# Patient Record
Sex: Female | Born: 1965 | Race: Black or African American | Hispanic: No | Marital: Married | Smoking: Never smoker
Health system: Southern US, Community
[De-identification: ages and names within clinical notes are randomized; demographics above are authoritative.]

## PROBLEM LIST (undated history)

## (undated) DIAGNOSIS — F329 Major depressive disorder, single episode, unspecified: Secondary | ICD-10-CM

## (undated) DIAGNOSIS — T7840XA Allergy, unspecified, initial encounter: Secondary | ICD-10-CM

## (undated) DIAGNOSIS — R739 Hyperglycemia, unspecified: Secondary | ICD-10-CM

## (undated) DIAGNOSIS — O4400 Placenta previa specified as without hemorrhage, unspecified trimester: Secondary | ICD-10-CM

## (undated) DIAGNOSIS — Z8639 Personal history of other endocrine, nutritional and metabolic disease: Secondary | ICD-10-CM

## (undated) DIAGNOSIS — T884XXA Failed or difficult intubation, initial encounter: Secondary | ICD-10-CM

## (undated) DIAGNOSIS — J309 Allergic rhinitis, unspecified: Secondary | ICD-10-CM

## (undated) DIAGNOSIS — Z86018 Personal history of other benign neoplasm: Secondary | ICD-10-CM

## (undated) DIAGNOSIS — M25562 Pain in left knee: Secondary | ICD-10-CM

## (undated) DIAGNOSIS — K219 Gastro-esophageal reflux disease without esophagitis: Secondary | ICD-10-CM

## (undated) DIAGNOSIS — D509 Iron deficiency anemia, unspecified: Secondary | ICD-10-CM

## (undated) DIAGNOSIS — M25561 Pain in right knee: Secondary | ICD-10-CM

## (undated) DIAGNOSIS — F32A Depression, unspecified: Secondary | ICD-10-CM

## (undated) DIAGNOSIS — N200 Calculus of kidney: Secondary | ICD-10-CM

## (undated) HISTORY — DX: Gastro-esophageal reflux disease without esophagitis: K21.9

## (undated) HISTORY — PX: COLONOSCOPY: SHX174

## (undated) HISTORY — DX: Iron deficiency anemia, unspecified: D50.9

## (undated) HISTORY — DX: Hyperglycemia, unspecified: R73.9

## (undated) HISTORY — DX: Calculus of kidney: N20.0

## (undated) HISTORY — DX: Allergic rhinitis, unspecified: J30.9

## (undated) HISTORY — DX: Complete placenta previa nos or without hemorrhage, unspecified trimester: O44.00

## (undated) HISTORY — PX: WISDOM TOOTH EXTRACTION: SHX21

## (undated) HISTORY — DX: Personal history of other benign neoplasm: Z86.018

## (undated) HISTORY — PX: POLYPECTOMY: SHX149

## (undated) HISTORY — DX: Allergy, unspecified, initial encounter: T78.40XA

## (undated) HISTORY — PX: ROTATOR CUFF REPAIR: SHX139

## (undated) SURGERY — Surgical Case
Anesthesia: *Unknown

---

## 1989-12-25 DIAGNOSIS — O4400 Placenta previa specified as without hemorrhage, unspecified trimester: Secondary | ICD-10-CM

## 1989-12-25 HISTORY — DX: Complete placenta previa nos or without hemorrhage, unspecified trimester: O44.00

## 1994-12-25 HISTORY — PX: BACK SURGERY: SHX140

## 2001-01-14 ENCOUNTER — Other Ambulatory Visit: Admission: RE | Admit: 2001-01-14 | Discharge: 2001-01-14 | Payer: Self-pay | Admitting: Family Medicine

## 2001-01-14 ENCOUNTER — Other Ambulatory Visit: Admission: RE | Admit: 2001-01-14 | Discharge: 2001-01-14 | Payer: Self-pay | Admitting: *Deleted

## 2002-04-22 ENCOUNTER — Other Ambulatory Visit: Admission: RE | Admit: 2002-04-22 | Discharge: 2002-04-22 | Payer: Self-pay | Admitting: Family Medicine

## 2003-03-31 ENCOUNTER — Other Ambulatory Visit: Admission: RE | Admit: 2003-03-31 | Discharge: 2003-03-31 | Payer: Self-pay | Admitting: Otolaryngology

## 2003-08-13 ENCOUNTER — Other Ambulatory Visit: Admission: RE | Admit: 2003-08-13 | Discharge: 2003-08-13 | Payer: Self-pay | Admitting: Family Medicine

## 2004-11-03 ENCOUNTER — Other Ambulatory Visit: Admission: RE | Admit: 2004-11-03 | Discharge: 2004-11-03 | Payer: Self-pay | Admitting: Family Medicine

## 2005-11-09 ENCOUNTER — Other Ambulatory Visit: Admission: RE | Admit: 2005-11-09 | Discharge: 2005-11-09 | Payer: Self-pay | Admitting: Family Medicine

## 2007-05-13 ENCOUNTER — Other Ambulatory Visit: Admission: RE | Admit: 2007-05-13 | Discharge: 2007-05-13 | Payer: Self-pay | Admitting: Family Medicine

## 2008-07-27 ENCOUNTER — Ambulatory Visit: Payer: Self-pay | Admitting: Internal Medicine

## 2008-08-17 ENCOUNTER — Ambulatory Visit: Payer: Self-pay | Admitting: Internal Medicine

## 2010-12-25 DIAGNOSIS — R739 Hyperglycemia, unspecified: Secondary | ICD-10-CM

## 2010-12-25 HISTORY — DX: Hyperglycemia, unspecified: R73.9

## 2011-08-14 ENCOUNTER — Ambulatory Visit: Payer: Self-pay

## 2011-08-21 ENCOUNTER — Encounter: Payer: Self-pay | Admitting: *Deleted

## 2011-08-21 ENCOUNTER — Encounter: Payer: BC Managed Care – PPO | Attending: Obstetrics and Gynecology | Admitting: *Deleted

## 2011-08-21 DIAGNOSIS — Z713 Dietary counseling and surveillance: Secondary | ICD-10-CM | POA: Insufficient documentation

## 2011-08-21 DIAGNOSIS — R7309 Other abnormal glucose: Secondary | ICD-10-CM | POA: Insufficient documentation

## 2011-08-21 NOTE — Progress Notes (Signed)
  Medical Nutrition Therapy:  Appt start time: 08:15a  end time:  09:15a  Assessment:  Primary concerns today: Hyperglycemia.  Pt here with hyperglycemia and upward trending A1c (5.7% to 5.8% in 3 months). Food recall reveals increased CHO and fat at meals and pt reports eating lunch and dinner out often.  Also reports daily breakfast skipping. Pt works in high stress job and travels often to territory via car.  States she snacks on chips, almonds, etc. "quite a bit" to keep her going.  Has made some changes to dietary intake since Feb 2012 including decrease of 20 oz Pepsi/day to 20 oz only 2x/month.   MEDICATIONS:  Vit D, Fe, MVI    DIETARY INTAKE:  Usual eating pattern includes 2 meals and 1-2 snacks per day.  24-hr recall:  B ( AM): SKIPS  Snk ( AM): None L (PM): [employer] chicken nuggets w/ sauce, FF; Orange drink or lemonade Snk ( PM): Almonds, chips D ( PM): Kickback [employer] w/ FF; water Snk ( PM): None  Usual physical activity: Recently increased exercise; inconsistent, but [place] or [place] 30 min/day, 2x/week  Estimated energy needs: 1300-1500 calories 160-185 g carbohydrates 65-75 g protein 45-50 g fat  Progress Towards Goal(s):  NEW   Nutritional Diagnosis:  [place]-2.2 Altered nutrition-related laboratory related to excessive CHO intake as evidenced by upward trend of A1c (5.7% to 5.8%) over last 6 months.    Intervention/Goals:  Follow Diabetes Meal Plan as instructed (see Yellow Card).  Eat 3 meals and 2 snacks, every 3-5 hrs. NO meal skipping.  Limit carbohydrate intake to 45 grams carbohydrate/meal.  Limit carbohydrate intake to 15 grams carbohydrate/snack.  Add lean protein foods to all meals/snacks.  Limit meals away from home - try taking lunch.   Aim for 30-45 mins of physical activity 2-3 days a week.  Handouts given during visit include:  "Living Well with Diabetes" booklet  CHO/Pro snack list  CHO meal plans (45g,  60g)  Monitoring/Evaluation:  Dietary intake, exercise, A1c, and body weight in 2 month(s).

## 2011-08-21 NOTE — Patient Instructions (Addendum)
Goals:  Follow Diabetes Meal Plan as instructed (see Yellow Card).  Eat 3 meals and 2 snacks, every 3-5 hrs. NO meal skipping.  Limit carbohydrate intake to 45 grams carbohydrate/meal.  Limit carbohydrate intake to 15 grams carbohydrate/snack.  Add lean protein foods to all meals/snacks.  Limit meals away from home - try taking lunch.   Aim for 30-45 mins of physical activity 2-3 days a week.

## 2012-07-13 ENCOUNTER — Other Ambulatory Visit: Payer: Self-pay | Admitting: Internal Medicine

## 2012-07-13 ENCOUNTER — Ambulatory Visit
Admission: RE | Admit: 2012-07-13 | Discharge: 2012-07-13 | Disposition: A | Discharge status: Home / Self Care | Payer: BC Managed Care – PPO | Source: Ambulatory Visit | Attending: Internal Medicine | Admitting: Internal Medicine

## 2012-07-13 DIAGNOSIS — R52 Pain, unspecified: Secondary | ICD-10-CM

## 2013-08-19 ENCOUNTER — Encounter: Payer: Self-pay | Admitting: Internal Medicine

## 2013-08-28 ENCOUNTER — Encounter: Payer: Self-pay | Admitting: Internal Medicine

## 2013-11-02 ENCOUNTER — Emergency Department (INDEPENDENT_AMBULATORY_CARE_PROVIDER_SITE_OTHER)
Admission: EM | Admit: 2013-11-02 | Discharge: 2013-11-02 | Disposition: A | Discharge status: Home / Self Care | Payer: BC Managed Care – PPO | Source: Home / Self Care | Attending: Family Medicine | Admitting: Family Medicine

## 2013-11-02 ENCOUNTER — Encounter (HOSPITAL_COMMUNITY): Payer: Self-pay | Admitting: Emergency Medicine

## 2013-11-02 DIAGNOSIS — S239XXA Sprain of unspecified parts of thorax, initial encounter: Secondary | ICD-10-CM

## 2013-11-02 DIAGNOSIS — S29012A Strain of muscle and tendon of back wall of thorax, initial encounter: Secondary | ICD-10-CM

## 2013-11-02 MED ORDER — KETOROLAC TROMETHAMINE 30 MG/ML IJ SOLN
30.0000 mg | Freq: Once | INTRAMUSCULAR | Status: AC
  Administered 2013-11-02: 30 mg via INTRAMUSCULAR

## 2013-11-02 MED ORDER — CYCLOBENZAPRINE HCL 5 MG PO TABS: 5.0000 mg | ORAL_TABLET | Freq: Three times a day (TID) | ORAL | Status: DC | PRN

## 2013-11-02 MED ORDER — KETOROLAC TROMETHAMINE 30 MG/ML IJ SOLN
INTRAMUSCULAR | Status: AC
  Filled 2013-11-02: qty 1

## 2013-11-02 MED ORDER — DICLOFENAC POTASSIUM 50 MG PO TABS: 50.0000 mg | ORAL_TABLET | Freq: Three times a day (TID) | ORAL | Status: DC

## 2013-11-02 NOTE — ED Notes (Signed)
47 yr old is here with complaints of Left shoulder and back pain x 8 dys. She states she has been lifting a lot moving into new home. She states she has tried Aleve, Tramadol, and heating pad.  No other compliants

## 2013-11-02 NOTE — ED Provider Notes (Signed)
CSN: [id number]     Arrival date & time 11/02/13  1854 History   First MD Initiated Contact with Patient 11/02/13 1909     Chief Complaint  Patient presents with  . Shoulder Pain  . Back Pain   (Consider location/radiation/quality/duration/timing/severity/associated sxs/prior Treatment) Patient is a 47 y.o. female presenting with shoulder pain. The history is provided by the patient.  Shoulder Pain This is a new problem. The current episode started more than 2 days ago (moving and lifting heavy boxes, NKI.). The problem has been gradually worsening. Pertinent negatives include no shortness of breath.    Past Medical History  Diagnosis Date  . History of uterine fibroid     Per patient report  . Placenta previa 1991  . Hyperglycemia 2012    Dr. Carrington Clamp   Past Surgical History  Procedure Laterality Date  . Cesarean section  08/15/90   Family History  Problem Relation Age of Onset  . Diabetes Maternal Aunt   . Diabetes Maternal Aunt   . Cancer Other     Colon   History  Substance Use Topics  . Smoking status: Never Smoker   . Smokeless tobacco: Not on file  . Alcohol Use: 0.0 oz/week     Comment: Rare; social drinker   OB History   Grav Para Term Preterm Abortions TAB SAB Ect Mult Living                 Review of Systems  Constitutional: Negative.   Respiratory: Negative for shortness of breath.   Cardiovascular: Negative.   Gastrointestinal: Negative.   Musculoskeletal: Positive for back pain and myalgias.    Allergies  Sulfamethoxazole-trimethoprim and Bactrim  Home Medications   Current Outpatient Rx  Name  Route  Sig  Dispense  Refill  . Cholecalciferol (VITAMIN D PO)   Oral   Take 1 tablet by mouth daily.           . ferrous sulfate (IRON SUPPLEMENT) 325 (65 FE) MG tablet   Oral   Take 325 mg by mouth daily.           . Multiple Vitamins-Calcium (ONE-A-DAY WOMENS) tablet   Oral   Take 1 tablet by mouth daily.           .  cyclobenzaprine (FLEXERIL) 5 MG tablet   Oral   Take 1 tablet (5 mg total) by mouth 3 (three) times daily as needed for muscle spasms.   30 tablet   0   . diclofenac (CATAFLAM) 50 MG tablet   Oral   Take 1 tablet (50 mg total) by mouth 3 (three) times daily. For pain   21 tablet   0    BP 145/76  Pulse 84  Temp(Src) 97.7 F (36.5 C) (Oral)  Resp 18  SpO2 100%  LMP 09/02/2013 Physical Exam  Nursing note and vitals reviewed. Constitutional: She is oriented to person, place, and time. She appears well-developed and well-nourished.  Neck: Normal range of motion. Neck supple.  Cardiovascular: Normal rate.   Pulmonary/Chest: Effort normal and breath sounds normal. She exhibits tenderness.  Acute tenderness to left medial scapula over deltoid to palpation and with rom., nv intact  Neurological: She is alert and oriented to person, place, and time.  Skin: Skin is warm and dry.    ED Course  Procedures (including critical care time) Labs Review Labs Reviewed - No data to display Imaging Review No results found.  EKG Interpretation  Ventricular Rate:    PR Interval:    QRS Duration:   QT Interval:    QTC Calculation:   R Axis:     Text Interpretation:              MDM     Linna Hoff, MD 11/02/13 1940

## 2013-12-05 ENCOUNTER — Other Ambulatory Visit (HOSPITAL_COMMUNITY): Payer: Self-pay

## 2013-12-05 DIAGNOSIS — R1310 Dysphagia, unspecified: Secondary | ICD-10-CM

## 2013-12-10 ENCOUNTER — Ambulatory Visit (HOSPITAL_COMMUNITY)
Admission: RE | Admit: 2013-12-10 | Discharge: 2013-12-10 | Disposition: A | Discharge status: Home / Self Care | Payer: BC Managed Care – PPO | Source: Ambulatory Visit | Attending: Internal Medicine | Admitting: Internal Medicine

## 2013-12-10 DIAGNOSIS — R6889 Other general symptoms and signs: Secondary | ICD-10-CM | POA: Insufficient documentation

## 2013-12-10 DIAGNOSIS — R1313 Dysphagia, pharyngeal phase: Secondary | ICD-10-CM | POA: Insufficient documentation

## 2013-12-10 DIAGNOSIS — R1310 Dysphagia, unspecified: Secondary | ICD-10-CM

## 2013-12-10 DIAGNOSIS — R1311 Dysphagia, oral phase: Secondary | ICD-10-CM | POA: Insufficient documentation

## 2013-12-10 DIAGNOSIS — R05 Cough: Secondary | ICD-10-CM | POA: Insufficient documentation

## 2013-12-10 DIAGNOSIS — R059 Cough, unspecified: Secondary | ICD-10-CM | POA: Insufficient documentation

## 2013-12-11 DIAGNOSIS — R1313 Dysphagia, pharyngeal phase: Secondary | ICD-10-CM | POA: Diagnosis not present

## 2013-12-11 NOTE — Procedures (Signed)
Objective Swallowing Evaluation: Modified Barium Swallowing Study  Late entry  Patient Details  Name: Caitlin Hansen MRN: [id number] Date of Birth: Nov 23, 1966  Today's Date: 12/10/2013 Time: 1130-1200 SLP Time Calculation (min): 30 min  Past Medical History:  Past Medical History  Diagnosis Date  . History of uterine fibroid     Per patient report  . Placenta previa 1991  . Hyperglycemia 2012    Dr. Carrington Clamp   Past Surgical History:  Past Surgical History  Procedure Laterality Date  . Cesarean section  08/15/90   HPI:  Pt is a 47 year old female arriving for an outpatient MBS due to report of frequent coughing/choking, particularly with liquids. She denies any history of GERD or pneumonia. She does report she often wakes in the night coughing and has a feeling of something in her throat after eating.      Assessment / Plan / Recommendation Clinical Impression  Dysphagia Diagnosis: Mild oral phase dysphagia Clinical impression: Pt presents with a mild oral dysphagia exacerbated by pt anxiety related to choking. Pt briefly holds bolus with inadequate base of tongue to velum pressure allowing for poor posterior bolus containment and premature spillage to the pyriforms with few intances of flash penetration. Pt does not aspirate, but does demonstrate a hypersensitivity to these events with immediate cough. This is improved with a chin tuck which allows pt to hold bolus more anteriorally in mouth prior to bolus transit. Pt reports she does this instinctively sometimes. Furthermore, after the swallow is complete, pt has frequent throat clearing and cough, though nothing is present in pharynx of airway. Question referred sensation from esophagus. Pt has other complaints that could indicate a type of 'silent reflux' that could contribute to throat clearing and globus. Pt recommended to continue current diet with a chin tuck and f/u with primary MD regarding results of exam. No SLP f/u  needed.     Treatment Recommendation  No treatment recommended at this time    Diet Recommendation Regular;Thin liquid   Liquid Administration via: Cup;Straw Medication Administration: Whole meds with puree Supervision: Patient able to self feed Postural Changes and/or Swallow Maneuvers: Chin tuck    Other  Recommendations Oral Care Recommendations: Patient independent with oral care   Follow Up Recommendations  None    Frequency and Duration        Pertinent Vitals/Pain NA    SLP Swallow Goals     General HPI: Pt is a 47 year old female arriving for an outpatient MBS due to report of frequent coughing/choking, particularly with liquids. She denies any history of GERD or pneumonia. She does report she often wakes in the night coughing and has a feeling of something in her throat after eating.  Type of Study: Modified Barium Swallowing Study Reason for Referral: Objectively evaluate swallowing function Previous Swallow Assessment: none Diet Prior to this Study: Regular;Thin liquids Temperature Spikes Noted: No Respiratory Status: Room air History of Recent Intubation: No Behavior/Cognition: Alert;Cooperative;Pleasant mood Oral Cavity - Dentition: Adequate natural dentition Oral Motor / Sensory Function: Within functional limits Self-Feeding Abilities: Able to feed self Patient Positioning: Upright in chair Baseline Vocal Quality: Clear Volitional Cough: Strong Volitional Swallow: Able to elicit Anatomy: Within functional limits    Reason for Referral Objectively evaluate swallowing function   Oral Phase Oral Preparation/Oral Phase Oral Phase: Impaired Oral - Thin Oral - Thin Cup: Holding of bolus;Piecemeal swallowing;Delayed oral transit (posterior bolus loss) Oral - Thin Straw: Holding of bolus;Piecemeal swallowing;Delayed oral transit  Oral - Solids Oral - Puree: Delayed oral transit;Piecemeal swallowing Oral - Regular: Piecemeal swallowing;Delayed oral  transit Oral - Pill: Holding of bolus;Reduced posterior propulsion;Delayed oral transit (extreme posterior head tilt)   Pharyngeal Phase Pharyngeal Phase Pharyngeal Phase: Impaired Pharyngeal - Thin Pharyngeal - Thin Cup: Premature spillage to pyriform sinuses;Penetration/Aspiration before swallow Penetration/Aspiration details (thin cup): Material does not enter airway;Material enters airway, remains ABOVE vocal cords then ejected out Pharyngeal - Thin Straw: Premature spillage to pyriform sinuses;Penetration/Aspiration before swallow Penetration/Aspiration details (thin straw): Material does not enter airway;Material enters airway, remains ABOVE vocal cords then ejected out Pharyngeal - Solids Pharyngeal - Puree: Within functional limits Pharyngeal - Regular: Within functional limits Pharyngeal - Pill: Within functional limits  Cervical Esophageal Phase    GO    Cervical Esophageal Phase Cervical Esophageal Phase: [facility]    Functional Assessment Tool Used: clinical judgement Functional Limitations: Swallowing Swallow Current Status ([id number]): At least 1 percent but less than 20 percent impaired, limited or restricted Swallow Goal Status [phone]): At least 1 percent but less than 20 percent impaired, limited or restricted Swallow Discharge Status [phone]): At least 1 percent but less than 20 percent impaired, limited or restricted    Kateryn Marasigan, Riley Nearing 12/11/2013, 10:41 PM

## 2013-12-17 ENCOUNTER — Other Ambulatory Visit: Payer: Self-pay | Admitting: Obstetrics and Gynecology

## 2013-12-17 DIAGNOSIS — Z3042 Encounter for surveillance of injectable contraceptive: Secondary | ICD-10-CM

## 2013-12-19 ENCOUNTER — Ambulatory Visit
Admission: RE | Admit: 2013-12-19 | Discharge: 2013-12-19 | Disposition: A | Discharge status: Home / Self Care | Payer: BC Managed Care – PPO | Source: Ambulatory Visit | Attending: Obstetrics and Gynecology | Admitting: Obstetrics and Gynecology

## 2013-12-19 DIAGNOSIS — Z3042 Encounter for surveillance of injectable contraceptive: Secondary | ICD-10-CM

## 2014-01-15 ENCOUNTER — Ambulatory Visit
Admission: RE | Admit: 2014-01-15 | Discharge: 2014-01-15 | Disposition: A | Discharge status: Home / Self Care | Payer: BC Managed Care – PPO | Source: Ambulatory Visit | Attending: Obstetrics and Gynecology | Admitting: Obstetrics and Gynecology

## 2014-04-06 ENCOUNTER — Emergency Department (HOSPITAL_COMMUNITY): Payer: BC Managed Care – PPO

## 2014-04-06 ENCOUNTER — Encounter (HOSPITAL_COMMUNITY): Payer: Self-pay | Admitting: Emergency Medicine

## 2014-04-06 ENCOUNTER — Emergency Department (HOSPITAL_COMMUNITY)
Admission: EM | Admit: 2014-04-06 | Discharge: 2014-04-07 | Disposition: A | Discharge status: Home / Self Care | Payer: BC Managed Care – PPO | Attending: Emergency Medicine | Admitting: Emergency Medicine

## 2014-04-06 DIAGNOSIS — Z8742 Personal history of other diseases of the female genital tract: Secondary | ICD-10-CM | POA: Diagnosis not present

## 2014-04-06 DIAGNOSIS — S40019A Contusion of unspecified shoulder, initial encounter: Secondary | ICD-10-CM | POA: Diagnosis not present

## 2014-04-06 DIAGNOSIS — S139XXA Sprain of joints and ligaments of unspecified parts of neck, initial encounter: Secondary | ICD-10-CM | POA: Diagnosis not present

## 2014-04-06 DIAGNOSIS — Y92410 Unspecified street and highway as the place of occurrence of the external cause: Secondary | ICD-10-CM | POA: Insufficient documentation

## 2014-04-06 DIAGNOSIS — Y9389 Activity, other specified: Secondary | ICD-10-CM | POA: Diagnosis not present

## 2014-04-06 DIAGNOSIS — S0990XA Unspecified injury of head, initial encounter: Secondary | ICD-10-CM | POA: Diagnosis not present

## 2014-04-06 DIAGNOSIS — Z79899 Other long term (current) drug therapy: Secondary | ICD-10-CM | POA: Diagnosis not present

## 2014-04-06 DIAGNOSIS — S0993XA Unspecified injury of face, initial encounter: Secondary | ICD-10-CM | POA: Diagnosis present

## 2014-04-06 DIAGNOSIS — S161XXA Strain of muscle, fascia and tendon at neck level, initial encounter: Secondary | ICD-10-CM

## 2014-04-06 MED ORDER — OXYCODONE-ACETAMINOPHEN 5-325 MG PO TABS
2.0000 | ORAL_TABLET | Freq: Once | ORAL | Status: AC
  Administered 2014-04-06: 2 via ORAL
  Filled 2014-04-06: qty 2

## 2014-04-06 MED ORDER — CYCLOBENZAPRINE HCL 10 MG PO TABS
5.0000 mg | ORAL_TABLET | Freq: Once | ORAL | Status: AC
  Administered 2014-04-06: 5 mg via ORAL
  Filled 2014-04-06: qty 1
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
## 2014-04-07 DIAGNOSIS — S139XXA Sprain of joints and ligaments of unspecified parts of neck, initial encounter: Secondary | ICD-10-CM | POA: Diagnosis not present

## 2014-04-07 MED ORDER — ONDANSETRON HCL 4 MG PO TABS: 4.0000 mg | ORAL_TABLET | Freq: Four times a day (QID) | ORAL | Status: DC

## 2014-04-07 MED ORDER — ONDANSETRON 4 MG PO TBDP
8.0000 mg | ORAL_TABLET | Freq: Once | ORAL | Status: AC
  Administered 2014-04-07: 8 mg via ORAL
  Filled 2014-04-07: qty 2

## 2014-04-07 MED ORDER — HYDROCODONE-ACETAMINOPHEN 5-325 MG PO TABS: 1.0000 | ORAL_TABLET | Freq: Four times a day (QID) | ORAL | Status: DC | PRN

## 2014-04-07 MED ORDER — CYCLOBENZAPRINE HCL 10 MG PO TABS: 10.0000 mg | ORAL_TABLET | Freq: Two times a day (BID) | ORAL | Status: DC | PRN
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
## 2014-04-22 ENCOUNTER — Other Ambulatory Visit: Payer: Self-pay | Admitting: Internal Medicine

## 2014-04-22 DIAGNOSIS — M25512 Pain in left shoulder: Secondary | ICD-10-CM

## 2014-04-22 DIAGNOSIS — R519 Headache, unspecified: Secondary | ICD-10-CM

## 2014-04-22 DIAGNOSIS — R51 Headache: Secondary | ICD-10-CM

## 2014-04-29 ENCOUNTER — Ambulatory Visit
Admission: RE | Admit: 2014-04-29 | Discharge: 2014-04-29 | Disposition: A | Discharge status: Home / Self Care | Payer: BC Managed Care – PPO | Source: Ambulatory Visit | Attending: Internal Medicine | Admitting: Internal Medicine

## 2014-04-29 DIAGNOSIS — R51 Headache: Secondary | ICD-10-CM

## 2014-04-29 DIAGNOSIS — R519 Headache, unspecified: Secondary | ICD-10-CM

## 2014-04-29 DIAGNOSIS — M25512 Pain in left shoulder: Secondary | ICD-10-CM

## 2014-07-15 ENCOUNTER — Encounter: Payer: Self-pay | Admitting: Internal Medicine

## 2014-10-08 ENCOUNTER — Encounter: Payer: Self-pay | Admitting: Internal Medicine

## 2014-11-13 ENCOUNTER — Ambulatory Visit (AMBULATORY_SURGERY_CENTER): Payer: Self-pay | Admitting: *Deleted

## 2014-11-13 VITALS — Ht 65.0 in | Wt 163.0 lb

## 2014-11-13 DIAGNOSIS — Z800 Family history of malignant neoplasm of digestive organs: Secondary | ICD-10-CM

## 2014-11-13 MED ORDER — MOVIPREP 100 G PO SOLR: 1.0000 | Freq: Once | ORAL | Status: DC

## 2014-11-13 NOTE — Progress Notes (Signed)
No egg or soy allergy. No anesthesia problems.  

## 2014-11-23 ENCOUNTER — Encounter: Payer: Self-pay | Admitting: Internal Medicine

## 2014-11-23 ENCOUNTER — Ambulatory Visit (AMBULATORY_SURGERY_CENTER): Payer: BC Managed Care – PPO | Admitting: Internal Medicine

## 2014-11-23 VITALS — BP 148/77 | HR 80 | Temp 97.8°F | Resp 14 | Ht 65.0 in | Wt 163.0 lb

## 2014-11-23 DIAGNOSIS — Z800 Family history of malignant neoplasm of digestive organs: Secondary | ICD-10-CM

## 2014-11-23 MED ORDER — SODIUM CHLORIDE 0.9 % IV SOLN: 500.0000 mL | INTRAVENOUS | Status: DC

## 2014-11-23 NOTE — Progress Notes (Signed)
A/ox3, pleased with MAC, report to RN 

## 2014-11-23 NOTE — Op Note (Signed)
[place]  [address] [place], [address]   COLONOSCOPY PROCEDURE REPORT  PATIENT: Caitlin Hansen, Caitlin Hansen  MR#: [id number] BIRTHDATE: 02-Apr-1966 , 9  yrs. old GENDER: female ENDOSCOPIST: Eustace Quail, MD REFERRED DU:[id number] Recall, PROCEDURE DATE:  11/23/2014 PROCEDURE:   Colonoscopy, screening  (high risk) First Screening Colonoscopy - Avg.  risk and is 50 yrs.  old or older - No.  Prior Negative Screening - Now for repeat screening. N/A  History of Adenoma - Now for follow-up colonoscopy & has been > or = to 3 yrs.  N/A  Polyps Removed Today? No.  Recommend repeat exam, <10 yrs? Yes.  High risk (family or personal hx). ASA CLASS:   Class I INDICATIONS:patient's immediate family history of colon cancer(mom 54). Negative index exam August 2009. MEDICATIONS: Monitored anesthesia care and Propofol 250 mg IV  DESCRIPTION OF PROCEDURE:   After the risks benefits and alternatives of the procedure were thoroughly explained, informed consent was obtained.  The digital rectal exam revealed no abnormalities of the rectum.   The LB [id number]  endoscope was introduced through the anus and advanced to the cecum, which was identified by both the appendix and ileocecal valve. No adverse events experienced.   The quality of the prep was excellent, using MoviPrep  The instrument was then slowly withdrawn as the colon was fully examined.     COLON FINDINGS: A normal appearing cecum, ileocecal valve, and appendiceal orifice were identified.  The ascending, transverse, descending, sigmoid colon, and rectum appeared unremarkable. Retroflexed views revealed no abnormalities. The time to cecum=1 minutes 37 seconds.  Withdrawal time=8 minutes 19 seconds.  The scope was withdrawn and the procedure completed. COMPLICATIONS: There were no immediate complications.  ENDOSCOPIC IMPRESSION: Normal colonoscopy  RECOMMENDATIONS: Follow up colonoscopy in 5 years (family  history)  eSigned:  Eustace Quail, MD 11/23/2014 1:53 PM   cc: Trilby Drummer MD and The Patient

## 2014-11-23 NOTE — Patient Instructions (Signed)

## 2014-11-24 ENCOUNTER — Telehealth: Payer: Self-pay | Admitting: *Deleted

## 2014-11-24 NOTE — Telephone Encounter (Signed)
  Follow up Call-  Call back number 11/23/2014  Post procedure Call Back phone  # [phone]  Permission to leave phone message Yes     Patient questions:  Do you have a fever, pain , or abdominal swelling? No. Pain Score  0 *  Have you tolerated food without any problems? Yes.    Have you been able to return to your normal activities? Yes.    Do you have any questions about your discharge instructions: Diet   No. Medications  No. Follow up visit  No.  Do you have questions or concerns about your Care? No.  Actions: * If pain score is 4 or above: No action needed, pain <4.

## 2014-12-14 ENCOUNTER — Encounter: Payer: BC Managed Care – PPO | Admitting: Internal Medicine

## 2015-11-12 ENCOUNTER — Encounter: Payer: Self-pay | Admitting: Internal Medicine

## 2016-03-24 ENCOUNTER — Other Ambulatory Visit: Payer: Self-pay | Admitting: Obstetrics and Gynecology

## 2016-04-11 NOTE — Patient Instructions (Signed)
Your procedure is scheduled on:  Wednesday, April 19, 2016  Enter through the [employer] at: 9:00 AM  Pick up the phone at the desk and dial [phone].  Call this number if you have problems the morning of surgery: [phone].  Remember:  Do NOT eat food or drink after:  Midnight Tuesday  Take these medicines the morning of surgery with a SIP OF WATER:  None  Do NOT wear jewelry (body piercing), metal hair clips/bobby pins, make-up, or nail polish. Do NOT wear lotions, powders, or perfumes.  You may wear deodorant. Do NOT shave for 48 hours prior to surgery. Do NOT bring valuables to the hospital. Contacts, dentures, or bridgework may not be worn into surgery.  Leave suitcase in car.  After surgery it may be brought to your room.  For patients admitted to the hospital, checkout time is 11:00 AM the day of discharge.

## 2016-04-12 ENCOUNTER — Encounter (HOSPITAL_COMMUNITY): Payer: Self-pay

## 2016-04-12 ENCOUNTER — Encounter (HOSPITAL_COMMUNITY)
Admission: RE | Admit: 2016-04-12 | Discharge: 2016-04-12 | Disposition: A | Discharge status: Home / Self Care | Source: Ambulatory Visit | Attending: Obstetrics and Gynecology | Admitting: Obstetrics and Gynecology

## 2016-04-12 DIAGNOSIS — Z01812 Encounter for preprocedural laboratory examination: Secondary | ICD-10-CM | POA: Insufficient documentation

## 2016-04-12 DIAGNOSIS — D259 Leiomyoma of uterus, unspecified: Secondary | ICD-10-CM | POA: Diagnosis not present

## 2016-04-12 DIAGNOSIS — F329 Major depressive disorder, single episode, unspecified: Secondary | ICD-10-CM | POA: Diagnosis not present

## 2016-04-12 DIAGNOSIS — N939 Abnormal uterine and vaginal bleeding, unspecified: Secondary | ICD-10-CM | POA: Insufficient documentation

## 2016-04-12 HISTORY — DX: Major depressive disorder, single episode, unspecified: F32.9

## 2016-04-12 HISTORY — DX: Depression, unspecified: F32.A

## 2016-04-12 HISTORY — DX: Pain in left knee: M25.562

## 2016-04-12 HISTORY — DX: Pain in right knee: M25.561

## 2016-04-12 HISTORY — DX: Personal history of other endocrine, nutritional and metabolic disease: Z86.39

## 2016-04-12 LAB — CBC
HCT: 37.3 % (ref 36.0–46.0)
Hemoglobin: 12.4 g/dL (ref 12.0–15.0)
MCH: 30.5 pg (ref 26.0–34.0)
MCHC: 33.2 g/dL (ref 30.0–36.0)
MCV: 91.9 fL (ref 78.0–100.0)
Platelets: 306 10*3/uL (ref 150–400)
RBC: 4.06 MIL/uL (ref 3.87–5.11)
RDW: 12.4 % (ref 11.5–15.5)
WBC: 8.8 10*3/uL (ref 4.0–10.5)

## 2016-04-12 LAB — TYPE AND SCREEN
ABO/RH(D): B POS
Antibody Screen: NEGATIVE

## 2016-04-12 LAB — ABO/RH: ABO/RH(D): B POS

## 2016-04-13 NOTE — Anesthesia Preprocedure Evaluation (Addendum)
Anesthesia Evaluation  Patient identified by MRN, date of birth, ID band Patient awake    Reviewed: Allergy & Precautions, NPO status , Patient's Chart, lab work & pertinent test results  Airway Mallampati: II   Neck ROM: Full    Dental  (+) Dental Advisory Given, Teeth Intact   Pulmonary neg pulmonary ROS,    breath sounds clear to auscultation       Cardiovascular negative cardio ROS   Rhythm:Regular     Neuro/Psych Depression negative neurological ROS  negative psych ROS   GI/Hepatic negative GI ROS, Neg liver ROS,   Endo/Other  negative endocrine ROS  Renal/GU negative Renal ROS  negative genitourinary   Musculoskeletal negative musculoskeletal ROS (+)   Abdominal   Peds negative pediatric ROS (+)  Hematology negative hematology ROS (+) 12/37   Anesthesia Other Findings   Reproductive/Obstetrics negative OB ROS                            Anesthesia Physical Anesthesia Plan  ASA: II  Anesthesia Plan: General   Post-op Pain Management:    Induction: Intravenous  Airway Management Planned: Oral ETT  Additional Equipment:   Intra-op Plan:   Post-operative Plan: Extubation in OR  Informed Consent: I have reviewed the patients History and Physical, chart, labs and discussed the procedure including the risks, benefits and alternatives for the proposed anesthesia with the patient or authorized representative who has indicated his/her understanding and acceptance.     Plan Discussed with:   Anesthesia Plan Comments:         Anesthesia Quick Evaluation

## 2016-04-16 NOTE — H&P (Signed)
50 y.o. yo complains of continued bleeding on Depo Provera.  Pt has a significant fibroid uterus.  Uterus measures 10x6x7 with multiple fibroids, several submucosal and largest 4 cm.  Past Medical History  Diagnosis Date  . History of uterine fibroid     Per patient report  . Placenta previa 1991  . Hyperglycemia 2012    Dr. Bobbye Charleston  . Depression   . Knee pain, bilateral     aching, popping, "feels like they are going to give out"  . History of thyroid disease     patient took medication, but no longer requires medication   Past Surgical History  Procedure Laterality Date  . Cesarean section  08/15/90  . Rotator cuff repair    . Back surgery  1996    cyst removal  . Wisdom tooth extraction      Social History   Social History  . Marital Status: Married    Spouse Name: N/A  . Number of Children: N/A  . Years of Education: N/A   Occupational History  . Not on file.   Social History Main Topics  . Smoking status: Never Smoker   . Smokeless tobacco: Never Used  . Alcohol Use: 0.0 oz/week    0 Standard drinks or equivalent per week     Comment: Rare; social drinker  . Drug Use: No  . Sexual Activity: Yes    Birth Control/ Protection: Injection   Other Topics Concern  . Not on file   Social History Narrative    No current facility-administered medications on file prior to encounter.   Current Outpatient Prescriptions on File Prior to Encounter  Medication Sig Dispense Refill  . ibuprofen (ADVIL,MOTRIN) 200 MG tablet Take 400 mg by mouth every 6 (six) hours as needed for moderate pain.    . medroxyPROGESTERone (DEPO-PROVERA) 150 MG/ML injection Inject 150 mg into the muscle every 3 (three) months.    . Multiple Vitamins-Calcium (ONE-A-DAY WOMENS) tablet Take 1 tablet by mouth daily.        Allergies  Allergen Reactions  . Sulfamethoxazole-Trimethoprim Other (See Comments)    REACTION: vaginal itching    @VITALS2 @  Lungs: clear to ascultation Cor:   RRR Abdomen:  soft, nontender, nondistended. Ex:  no cords, erythema Pelvic:  Vulva: no masses, no atrophy, no lesions Vagina: no tenderness, no erythema, no abnormal vaginal discharge, no vesicle(s) or ulcers, no cystocele, no rectocele Cervix: grossly normal, no discharge, no cervical motion tenderness, sample taken for a Pap smear Uterus: normal size (12, bulky and mobile), normal shape, midline, no uterine prolapse, non-tender Bladder/Urethra: normal meatus, no urethral discharge, no urethral mass, bladder non distended, Urethra well supported Adnexa/Parametria: no parametrial tenderness, no parametrial mass, no adnexal tenderness, no ovarian mass   A: Symptomatic fibroid uterus with DUB.  EMB was benign.  Will retain ovaries if are normal.   P: All risks, benefits and alternatives d/w patient and she desires to proceed.  Patient has undergone a modified bowel prep and will receive preop antibiotics and SCDs during the operation.     Eppie Barhorst A

## 2016-04-19 ENCOUNTER — Encounter (HOSPITAL_COMMUNITY): Payer: Self-pay | Admitting: Anesthesiology

## 2016-04-19 ENCOUNTER — Encounter (HOSPITAL_COMMUNITY)
Admission: RE | Disposition: A | Discharge status: Home / Self Care | Payer: Self-pay | Source: Ambulatory Visit | Attending: Obstetrics and Gynecology

## 2016-04-19 ENCOUNTER — Ambulatory Visit (HOSPITAL_COMMUNITY): Admitting: Anesthesiology

## 2016-04-19 ENCOUNTER — Ambulatory Visit (HOSPITAL_COMMUNITY)
Admission: RE | Admit: 2016-04-19 | Discharge: 2016-04-20 | Disposition: A | Discharge status: Home / Self Care | Source: Ambulatory Visit | Attending: Obstetrics and Gynecology | Admitting: Obstetrics and Gynecology

## 2016-04-19 DIAGNOSIS — N921 Excessive and frequent menstruation with irregular cycle: Secondary | ICD-10-CM | POA: Diagnosis not present

## 2016-04-19 DIAGNOSIS — N83202 Unspecified ovarian cyst, left side: Secondary | ICD-10-CM | POA: Diagnosis not present

## 2016-04-19 DIAGNOSIS — N800 Endometriosis of uterus: Secondary | ICD-10-CM | POA: Diagnosis not present

## 2016-04-19 DIAGNOSIS — F329 Major depressive disorder, single episode, unspecified: Secondary | ICD-10-CM | POA: Insufficient documentation

## 2016-04-19 DIAGNOSIS — D251 Intramural leiomyoma of uterus: Secondary | ICD-10-CM | POA: Diagnosis not present

## 2016-04-19 DIAGNOSIS — N840 Polyp of corpus uteri: Secondary | ICD-10-CM | POA: Insufficient documentation

## 2016-04-19 DIAGNOSIS — Z9889 Other specified postprocedural states: Secondary | ICD-10-CM

## 2016-04-19 DIAGNOSIS — Z882 Allergy status to sulfonamides status: Secondary | ICD-10-CM | POA: Diagnosis not present

## 2016-04-19 HISTORY — PX: CYSTOSCOPY: SHX5120

## 2016-04-19 HISTORY — PX: ROBOTIC ASSISTED TOTAL HYSTERECTOMY WITH SALPINGECTOMY: SHX6679

## 2016-04-19 LAB — PREGNANCY, URINE: PREG TEST UR: NEGATIVE

## 2016-04-19 SURGERY — ROBOTIC ASSISTED TOTAL HYSTERECTOMY WITH SALPINGECTOMY
Anesthesia: General | Site: Bladder

## 2016-04-19 MED ORDER — FENTANYL CITRATE (PF) 100 MCG/2ML IJ SOLN
INTRAMUSCULAR | Status: DC | PRN
  Administered 2016-04-19 (×2): 100 ug via INTRAVENOUS

## 2016-04-19 MED ORDER — ONDANSETRON HCL 4 MG/2ML IJ SOLN
INTRAMUSCULAR | Status: AC
  Filled 2016-04-19: qty 2

## 2016-04-19 MED ORDER — GLYCOPYRROLATE 0.2 MG/ML IJ SOLN
INTRAMUSCULAR | Status: DC | PRN
  Administered 2016-04-19: .4 mg via INTRAVENOUS

## 2016-04-19 MED ORDER — ROCURONIUM BROMIDE 100 MG/10ML IV SOLN
INTRAVENOUS | Status: AC
  Filled 2016-04-19: qty 1

## 2016-04-19 MED ORDER — MEPERIDINE HCL 25 MG/ML IJ SOLN: 6.2500 mg | INTRAMUSCULAR | Status: DC | PRN

## 2016-04-19 MED ORDER — LIDOCAINE HCL (CARDIAC) 20 MG/ML IV SOLN
INTRAVENOUS | Status: AC
  Filled 2016-04-19: qty 5

## 2016-04-19 MED ORDER — PROPOFOL 10 MG/ML IV BOLUS
INTRAVENOUS | Status: DC | PRN
  Administered 2016-04-19: 50 mg via INTRAVENOUS
  Administered 2016-04-19: 150 mg via INTRAVENOUS

## 2016-04-19 MED ORDER — FENTANYL CITRATE (PF) 100 MCG/2ML IJ SOLN
25.0000 ug | INTRAMUSCULAR | Status: DC | PRN
  Administered 2016-04-19 (×2): 25 ug via INTRAVENOUS
  Administered 2016-04-19 (×2): 50 ug via INTRAVENOUS

## 2016-04-19 MED ORDER — CEFAZOLIN SODIUM-DEXTROSE 2-3 GM-% IV SOLR
INTRAVENOUS | Status: AC
  Filled 2016-04-19: qty 50

## 2016-04-19 MED ORDER — ONDANSETRON HCL 4 MG/2ML IJ SOLN
4.0000 mg | Freq: Four times a day (QID) | INTRAMUSCULAR | Status: DC | PRN
Start: 2016-04-19 — End: 2016-04-20
  Administered 2016-04-19: 4 mg via INTRAVENOUS
  Filled 2016-04-19: qty 2

## 2016-04-19 MED ORDER — LACTATED RINGERS IR SOLN
Status: DC | PRN
  Administered 2016-04-19: 3000 mL

## 2016-04-19 MED ORDER — SODIUM CHLORIDE 0.9 % IV SOLN
INTRAVENOUS | Status: DC | PRN
  Administered 2016-04-19: 60 mL

## 2016-04-19 MED ORDER — PROPOFOL 10 MG/ML IV BOLUS
INTRAVENOUS | Status: AC
  Filled 2016-04-19: qty 20

## 2016-04-19 MED ORDER — FENTANYL CITRATE (PF) 100 MCG/2ML IJ SOLN
INTRAMUSCULAR | Status: AC
  Filled 2016-04-19: qty 2

## 2016-04-19 MED ORDER — FENTANYL CITRATE (PF) 250 MCG/5ML IJ SOLN
INTRAMUSCULAR | Status: AC
  Filled 2016-04-19: qty 5

## 2016-04-19 MED ORDER — SCOPOLAMINE 1 MG/3DAYS TD PT72
MEDICATED_PATCH | TRANSDERMAL | Status: AC
  Administered 2016-04-19: 1.5 mg via TRANSDERMAL
  Filled 2016-04-19: qty 1

## 2016-04-19 MED ORDER — BUTORPHANOL TARTRATE 1 MG/ML IJ SOLN
1.0000 mg | Freq: Once | INTRAMUSCULAR | Status: AC
  Administered 2016-04-19: 1 mg via INTRAVENOUS
  Filled 2016-04-19: qty 1

## 2016-04-19 MED ORDER — NEOSTIGMINE METHYLSULFATE 10 MG/10ML IV SOLN
INTRAVENOUS | Status: DC | PRN
  Administered 2016-04-19: 2 mg via INTRAVENOUS

## 2016-04-19 MED ORDER — MENTHOL 3 MG MT LOZG: 1.0000 | LOZENGE | OROMUCOSAL | Status: DC | PRN

## 2016-04-19 MED ORDER — FENTANYL CITRATE (PF) 100 MCG/2ML IJ SOLN
INTRAMUSCULAR | Status: AC
  Administered 2016-04-19: 25 ug via INTRAVENOUS
  Filled 2016-04-19: qty 2

## 2016-04-19 MED ORDER — ONDANSETRON HCL 4 MG PO TABS: 4.0000 mg | ORAL_TABLET | Freq: Four times a day (QID) | ORAL | Status: DC | PRN

## 2016-04-19 MED ORDER — ONDANSETRON HCL 4 MG/2ML IJ SOLN
INTRAMUSCULAR | Status: DC | PRN
  Administered 2016-04-19: 4 mg via INTRAVENOUS

## 2016-04-19 MED ORDER — MIDAZOLAM HCL 5 MG/5ML IJ SOLN
INTRAMUSCULAR | Status: DC | PRN
  Administered 2016-04-19: 2 mg via INTRAVENOUS

## 2016-04-19 MED ORDER — ARTIFICIAL TEARS OP OINT
TOPICAL_OINTMENT | OPHTHALMIC | Status: AC
  Filled 2016-04-19: qty 3.5

## 2016-04-19 MED ORDER — IBUPROFEN 800 MG PO TABS
800.0000 mg | ORAL_TABLET | Freq: Three times a day (TID) | ORAL | Status: DC | PRN
  Administered 2016-04-20: 800 mg via ORAL
  Filled 2016-04-19: qty 1

## 2016-04-19 MED ORDER — DEXAMETHASONE SODIUM PHOSPHATE 10 MG/ML IJ SOLN
INTRAMUSCULAR | Status: DC | PRN
  Administered 2016-04-19 (×2): 5 mg via INTRAVENOUS

## 2016-04-19 MED ORDER — LIDOCAINE HCL (CARDIAC) 20 MG/ML IV SOLN
INTRAVENOUS | Status: DC | PRN
  Administered 2016-04-19: 40 mg via INTRAVENOUS
  Administered 2016-04-19: 60 mg via INTRAVENOUS

## 2016-04-19 MED ORDER — DEXAMETHASONE SODIUM PHOSPHATE 10 MG/ML IJ SOLN
INTRAMUSCULAR | Status: AC
  Filled 2016-04-19: qty 1

## 2016-04-19 MED ORDER — KETOROLAC TROMETHAMINE 30 MG/ML IJ SOLN
30.0000 mg | Freq: Four times a day (QID) | INTRAMUSCULAR | Status: DC
  Administered 2016-04-19: 30 mg via INTRAVENOUS

## 2016-04-19 MED ORDER — STERILE WATER FOR IRRIGATION IR SOLN
Status: DC | PRN
  Administered 2016-04-19: 1000 mL via INTRAVESICAL

## 2016-04-19 MED ORDER — LACTATED RINGERS IV SOLN
INTRAVENOUS | Status: DC
  Administered 2016-04-19 (×2): via INTRAVENOUS

## 2016-04-19 MED ORDER — KETOROLAC TROMETHAMINE 30 MG/ML IJ SOLN
INTRAMUSCULAR | Status: AC
  Administered 2016-04-19: 30 mg via INTRAVENOUS
  Filled 2016-04-19: qty 1

## 2016-04-19 MED ORDER — SCOPOLAMINE 1 MG/3DAYS TD PT72
1.0000 | MEDICATED_PATCH | Freq: Once | TRANSDERMAL | Status: DC
  Administered 2016-04-19: 1.5 mg via TRANSDERMAL

## 2016-04-19 MED ORDER — ACETAMINOPHEN 10 MG/ML IV SOLN
1000.0000 mg | Freq: Once | INTRAVENOUS | Status: AC
  Administered 2016-04-19: 1000 mg via INTRAVENOUS
  Filled 2016-04-19: qty 100

## 2016-04-19 MED ORDER — OXYCODONE-ACETAMINOPHEN 5-325 MG PO TABS
1.0000 | ORAL_TABLET | ORAL | Status: DC | PRN
  Administered 2016-04-19 – 2016-04-20 (×4): 2 via ORAL
  Filled 2016-04-19 (×4): qty 2

## 2016-04-19 MED ORDER — GLYCOPYRROLATE 0.2 MG/ML IJ SOLN
INTRAMUSCULAR | Status: AC
  Filled 2016-04-19: qty 2

## 2016-04-19 MED ORDER — MIDAZOLAM HCL 2 MG/2ML IJ SOLN
INTRAMUSCULAR | Status: AC
  Filled 2016-04-19: qty 2

## 2016-04-19 MED ORDER — ONDANSETRON HCL 4 MG/2ML IJ SOLN: 4.0000 mg | Freq: Once | INTRAMUSCULAR | Status: DC

## 2016-04-19 MED ORDER — CEFAZOLIN SODIUM-DEXTROSE 2-4 GM/100ML-% IV SOLN
2.0000 g | INTRAVENOUS | Status: AC
  Administered 2016-04-19: 2 g via INTRAVENOUS
  Filled 2016-04-19: qty 100

## 2016-04-19 MED ORDER — ROCURONIUM BROMIDE 100 MG/10ML IV SOLN
INTRAVENOUS | Status: DC | PRN
  Administered 2016-04-19: 10 mg via INTRAVENOUS
  Administered 2016-04-19: 5 mg via INTRAVENOUS
  Administered 2016-04-19: 45 mg via INTRAVENOUS

## 2016-04-19 SURGICAL SUPPLY — 77 items
APL SKNCLS STERI-STRIP NONHPOA (GAUZE/BANDAGES/DRESSINGS)
BARRIER ADHS 3X4 INTERCEED (GAUZE/BANDAGES/DRESSINGS) ×2 IMPLANT
BENZOIN TINCTURE PRP APPL 2/3 (GAUZE/BANDAGES/DRESSINGS) ×2 IMPLANT
BRR ADH 4X3 ABS CNTRL BYND (GAUZE/BANDAGES/DRESSINGS)
CANISTER SUCT 3000ML (MISCELLANEOUS) ×4 IMPLANT
CATH ROBINSON RED A/P 16FR (CATHETERS) ×4 IMPLANT
CHLORAPREP W/TINT 26ML (MISCELLANEOUS) ×4 IMPLANT
CLOTH BEACON ORANGE TIMEOUT ST (SAFETY) ×4 IMPLANT
CONT PATH 16OZ SNAP LID 3702 (MISCELLANEOUS) ×4 IMPLANT
COVER BACK TABLE 60X90IN (DRAPES) ×8 IMPLANT
COVER TIP SHEARS 8 DVNC (MISCELLANEOUS) ×3 IMPLANT
COVER TIP SHEARS 8MM DA VINCI (MISCELLANEOUS) ×1
DECANTER SPIKE VIAL GLASS SM (MISCELLANEOUS) ×4 IMPLANT
DEFOGGER SCOPE WARMER CLEARIFY (MISCELLANEOUS) ×4 IMPLANT
DRSG COVADERM PLUS 2X2 (GAUZE/BANDAGES/DRESSINGS) ×8 IMPLANT
DRSG OPSITE POSTOP 3X4 (GAUZE/BANDAGES/DRESSINGS) ×4 IMPLANT
DURAPREP 26ML APPLICATOR (WOUND CARE) ×4 IMPLANT
ELECT REM PT RETURN 9FT ADLT (ELECTROSURGICAL) ×4
ELECTRODE REM PT RTRN 9FT ADLT (ELECTROSURGICAL) ×3 IMPLANT
GAUZE VASELINE 3X9 (GAUZE/BANDAGES/DRESSINGS) IMPLANT
GLOVE BIO SURGEON STRL SZ 6.5 (GLOVE) ×4 IMPLANT
GLOVE BIO SURGEON STRL SZ7 (GLOVE) ×8 IMPLANT
GLOVE BIOGEL PI IND STRL 6.5 (GLOVE) ×3 IMPLANT
GLOVE BIOGEL PI IND STRL 7.0 (GLOVE) ×9 IMPLANT
GLOVE BIOGEL PI INDICATOR 6.5 (GLOVE) ×1
GLOVE BIOGEL PI INDICATOR 7.0 (GLOVE) ×3
GLOVE ECLIPSE 6.5 STRL STRAW (GLOVE) ×12 IMPLANT
GOWN STRL REUS W/TWL LRG LVL3 (GOWN DISPOSABLE) ×8 IMPLANT
GRASPER BIPOLAR FEN DA VINCI (INSTRUMENTS)
GRASPER BPLR FEN DVNC (INSTRUMENTS) IMPLANT
GYRUS RUMI II 2.5CM BLUE (DISPOSABLE) ×4
GYRUS RUMI II 3.5CM BLUE (DISPOSABLE)
GYRUS RUMI II 4.0CM BLUE (DISPOSABLE)
KIT ACCESSORY DA VINCI DISP (KITS) ×1
KIT ACCESSORY DVNC DISP (KITS) ×3 IMPLANT
LEGGING LITHOTOMY PAIR STRL (DRAPES) ×4 IMPLANT
LIQUID BAND (GAUZE/BANDAGES/DRESSINGS) ×4 IMPLANT
MANIPULATOR UTERINE 4.5 ZUMI (MISCELLANEOUS) IMPLANT
NEEDLE INSUFFLATION 120MM (ENDOMECHANICALS) ×4 IMPLANT
NS IRRIG 1000ML POUR BTL (IV SOLUTION) ×12 IMPLANT
OCCLUDER COLPOPNEUMO (BALLOONS) ×2 IMPLANT
PACK ROBOT WH (CUSTOM PROCEDURE TRAY) ×4 IMPLANT
PACK ROBOTIC GOWN (GOWN DISPOSABLE) ×4 IMPLANT
PAD PREP 24X48 CUFFED NSTRL (MISCELLANEOUS) ×8 IMPLANT
PAD TRENDELENBURG POSITION (MISCELLANEOUS) ×4 IMPLANT
POUCH LAPAROSCOPIC INSTRUMENT (MISCELLANEOUS) ×2 IMPLANT
RUMI II 3.0CM BLUE KOH-EFFICIE (DISPOSABLE) ×2 IMPLANT
RUMI II GYRUS 2.5CM BLUE (DISPOSABLE) ×1 IMPLANT
RUMI II GYRUS 3.5CM BLUE (DISPOSABLE) IMPLANT
RUMI II GYRUS 4.0CM BLUE (DISPOSABLE) IMPLANT
SET CYSTO W/LG BORE CLAMP LF (SET/KITS/TRAYS/PACK) ×4 IMPLANT
SET IRRIG TUBING LAPAROSCOPIC (IRRIGATION / IRRIGATOR) ×4 IMPLANT
SET TRI-LUMEN FLTR TB AIRSEAL (TUBING) ×2 IMPLANT
STRIP CLOSURE SKIN 1/2X4 (GAUZE/BANDAGES/DRESSINGS) ×2 IMPLANT
SUT DVC VLOC 180 0 12IN GS21 (SUTURE)
SUT VIC AB 0 CT1 27 (SUTURE) ×8
SUT VIC AB 0 CT1 27XBRD ANBCTR (SUTURE) ×12 IMPLANT
SUT VIC AB 2-0 CT2 27 (SUTURE) ×4 IMPLANT
SUT VIC AB 2-0 UR6 27 (SUTURE) ×4 IMPLANT
SUT VICRYL 0 UR6 27IN ABS (SUTURE) ×8 IMPLANT
SUT VICRYL RAPIDE 3 0 (SUTURE) ×8 IMPLANT
SUT VLOC 180 0 9IN  GS21 (SUTURE)
SUT VLOC 180 0 9IN GS21 (SUTURE) IMPLANT
SUTURE DVC VLC 180 0 12IN GS21 (SUTURE) IMPLANT
SYR 50ML LL SCALE MARK (SYRINGE) ×4 IMPLANT
TIP RUMI ORANGE 6.7MMX12CM (TIP) ×2 IMPLANT
TIP UTERINE 5.1X6CM LAV DISP (MISCELLANEOUS) IMPLANT
TIP UTERINE 6.7X10CM GRN DISP (MISCELLANEOUS) IMPLANT
TIP UTERINE 6.7X6CM WHT DISP (MISCELLANEOUS) IMPLANT
TIP UTERINE 6.7X8CM BLUE DISP (MISCELLANEOUS) IMPLANT
TOWEL OR 17X24 6PK STRL BLUE (TOWEL DISPOSABLE) ×8 IMPLANT
TRAY FOLEY CATH SILVER 14FR (SET/KITS/TRAYS/PACK) ×4 IMPLANT
TROCAR DILATING TIP 12MM 150MM (ENDOMECHANICALS) ×4 IMPLANT
TROCAR DISP BLADELESS 8 DVNC (TROCAR) ×3 IMPLANT
TROCAR DISP BLADELESS 8MM (TROCAR) ×1
TROCAR PORT AIRSEAL 5X120 (TROCAR) ×2 IMPLANT
WATER STERILE IRR 1000ML POUR (IV SOLUTION) ×12 IMPLANT

## 2016-04-19 NOTE — Brief Op Note (Signed)
04/19/2016  1:03 PM  PATIENT:  Caitlin Hansen  50 y.o. female  PRE-OPERATIVE DIAGNOSIS:  FIBROIDS, menometrarrhaghia  POST-OPERATIVE DIAGNOSIS:  FIBROIDS, same  PROCEDURE:  Procedure(s): ROBOTIC ASSISTED TOTAL HYSTERECTOMY WITH SALPINGECTOMY (Bilateral) CYSTOSCOPY (N/A)  SURGEON:  Surgeon(s) and Role:    * Bobbye Charleston, MD - Primary    * Jerelyn Charles, MD - Assisting   ANESTHESIA:   general  EBL:  Total I/O In: -  Out: 100 [Blood:100]  LOCAL MEDICATIONS USED:  OTHER Ropivicaine  SPECIMEN:  Source of Specimen:  uterus, cervix, tubes, fibroid  DISPOSITION OF SPECIMEN:  PATHOLOGY  COUNTS:  YES  TOURNIQUET:  * No tourniquets in log *  DICTATION: .Note written in EPIC  PLAN OF CARE: Admit to inpatient   PATIENT DISPOSITION:  PACU - hemodynamically stable.   Delay start of Pharmacological VTE agent (>24hrs) due to surgical blood loss or risk of bleeding: not applicable  Complications:  None.  Findings:  12 weeks size uterus.  R ovary was normal.  L ovary had a small cyst.  The ureters were identified during multiple points of the case and were always out of the field of dissection.  On cystoscopy, the bladder was intact and bilateral spill was seen from each ureteral orriface.    Medications:  Ancef.  Ropivicaine.    Technique:  After adequate anesthesia was achieved the patient was positioned, prepped and draped in usual sterile fashion.  A speculum was placed in the vagina and the cervix dilated with pratt dilators.  The 12 cm Rumi and 3 cm Koh ring were assembled and placed in proper fashion.  The  Speculum was removed and the bladder catheterized with a foley.    Attention was turned to the abdomen where a 1 cm incision was made 1 cm above the umbilicus.  The veress needle was introduced without aspiration of bowel contents or blood and the abdomen insufflated. The long 12 mm trocar was placed and the other three trocar sites were marked out, all approximately  10 cm from each other and the camera.  Two 8.5 mm trocars were placed on either side of the camera port and a 5 mm assistant port was placed 3 cm above the L iliac crest.  All trocars were inserted under direct visualization of the camera.  The patient was placed in trendelenburg and then the Robot docked.  The PK forceps were placed on arm 2 and the Hot shears on arm 1 and introduced under direct visualization of the camera.  I then broke scrub and sat down at the console.  The above findings were noted and the ureters identified well out of the field of dissection.  The right fallopian tube was isolated and cauterized with the PK.  The Utero-ovarian ligament was then divided with the PK cautery and shears.  The posterior broad ligament was then divided with the hot shears until the uterosacral ligament.  The Broad and cardinal ligaments were then cauterized against the cervix to the level of the Koh ring, securing the uterine artery.  Each pedicle was then incised with the shears.  The anterior leaf was then incised at the reflection of the vessico-uterine junction and the lateral bladder retracted inferiorly after the round ligament had been divided with the PK forceps.  The left tube was cauterized with the PK and divided with the shears;  then the left utero-ovarian ligament divided with the PK forceps and the scissors.  The round ligament was divided as  well and the posterior leaf of the broad ligament then divided with the hot shears. The broad and cardinal ligaments were then cauterized on the left in the same way.   At the level of the internal os, the uterine arteries were bilaterally cauterized with the PK.  The ureters were identified well out of the field of dissection.  .   The bladder was then able to be retracted inferiorly and the vesico-uterine fascia was incised in the midline until the bladder was removed one cm below the Koh ring.  The hot shears then circumferentially incised the vagina at  the level of the reflection on the [facility] ring.  Once the uterus and cervix were amputated, cautery was used to insure hemostasis of the cuff.  Once hemostasis was achieved, the instruments were changed to the mega needle driver and mega suture cut needle driver and the cuff was closed with a running stitches of 0-vicryl V loc.    Cautery was used to ensure hemostasis of the left pedicles very superficially.  The ureters were peristalsing bilaterally well and very lateral to the areas of operation.    The Robot was then undocked and I scrubbed back in.  The needle was removed and Bupivicaine was introduced into the pelvis.  The fascia of the 12 mm trocar was closed with a figure of 8 stitch of 0 vicryl.  The skin incisions were closed with subcuticular stitches of 3-0 vicryl Rapide and Dermabond.  All instruments were removed from the vagina and cystoscopy performed, revealing an intact bladder and vigourous spill of indigo carmine from each ureteral orifice.  The cystoscope was removed and the patient taken to the recovery room in stable condition.  Caitlin Hansen A

## 2016-04-19 NOTE — Anesthesia Postprocedure Evaluation (Signed)
Anesthesia Post Note  Patient: Caitlin Hansen  Procedure(s) Performed: Procedure(s) (LRB): ROBOTIC ASSISTED TOTAL HYSTERECTOMY WITH SALPINGECTOMY (Bilateral) CYSTOSCOPY (N/A)  Patient location during evaluation: PACU Anesthesia Type: General Level of consciousness: awake Pain management: pain level controlled Vital Signs Assessment: post-procedure vital signs reviewed and stable Respiratory status: spontaneous breathing Cardiovascular status: stable Postop Assessment: no signs of nausea or vomiting Anesthetic complications: no     Last Vitals:  Filed Vitals:   04/19/16 1345 04/19/16 1415  BP: 132/76 126/79  Pulse: 96   Temp:    Resp: 15     Last Pain:  Filed Vitals:   04/19/16 1421  PainSc: 8    Pain Goal: Patients Stated Pain Goal: 4 (04/19/16 1415)               [place]

## 2016-04-19 NOTE — Op Note (Addendum)
04/19/2016  1:03 PM  PATIENT:  Caitlin Hansen  50 y.o. female  PRE-OPERATIVE DIAGNOSIS:  FIBROIDS, menometrarrhaghia  POST-OPERATIVE DIAGNOSIS:  FIBROIDS, same  PROCEDURE:  Procedure(s): ROBOTIC ASSISTED TOTAL HYSTERECTOMY WITH SALPINGECTOMY (Bilateral) CYSTOSCOPY (N/A)  SURGEON:  Surgeon(s) and Role:    * Bobbye Charleston, MD - Primary    * Jerelyn Charles, MD - Assisting   ANESTHESIA:   general  EBL:  Total I/O In: -  Out: 100 [Blood:100]  LOCAL MEDICATIONS USED:  OTHER Ropivicaine  SPECIMEN:  Source of Specimen:  uterus, cervix, tubes, fibroid  DISPOSITION OF SPECIMEN:  PATHOLOGY  COUNTS:  YES  TOURNIQUET:  * No tourniquets in log *  DICTATION: .Note written in EPIC  PLAN OF CARE: Admit to inpatient   PATIENT DISPOSITION:  PACU - hemodynamically stable.   Delay start of Pharmacological VTE agent (>24hrs) due to surgical blood loss or risk of bleeding: not applicable  Complications:  None.  Findings:  12 weeks size uterus.  R ovary was normal.  L ovary had small cysts <1 cm.  The ureters were identified during multiple points of the case and were always out of the field of dissection.  On cystoscopy, the bladder was intact and bilateral spill was seen from each ureteral orriface.    Medications:  Ancef.  Ropivicaine.    Technique:  After adequate anesthesia was achieved the patient was positioned, prepped and draped in usual sterile fashion.  A speculum was placed in the vagina and the cervix dilated with pratt dilators.  The 12 cm Rumi and 3 cm Koh ring were assembled and placed in proper fashion.  The  Speculum was removed and the bladder catheterized with a foley.    Attention was turned to the abdomen where a 1 cm incision was made 1 cm above the umbilicus.  The veress needle was introduced without aspiration of bowel contents or blood and the abdomen insufflated. The long 12 mm trocar was placed and the other three trocar sites were marked out, all  approximately 10 cm from each other and the camera.  Two 8.5 mm trocars were placed on either side of the camera port and a 5 mm assistant port was placed 3 cm above the L iliac crest.  All trocars were inserted under direct visualization of the camera.  The patient was placed in trendelenburg and then the Robot docked.  The PK forceps were placed on arm 2 and the Hot shears on arm 1 and introduced under direct visualization of the camera.  I then broke scrub and sat down at the console.  The above findings were noted and the ureters identified well out of the field of dissection.  The right fallopian tube was isolated and cauterized with the PK.  The Utero-ovarian ligament was then divided with the PK cautery and shears.  The posterior broad ligament was then divided with the hot shears until the uterosacral ligament.  The Broad and cardinal ligaments were then cauterized against the cervix to the level of the Koh ring, securing the uterine artery.  Each pedicle was then incised with the shears.  The anterior leaf was then incised at the reflection of the vessico-uterine junction and the lateral bladder retracted inferiorly after the round ligament had been divided with the PK forceps.  The left tube was cauterized with the PK and divided with the shears;  then the left utero-ovarian ligament divided with the PK forceps and the scissors.  The round ligament was divided  as well and the posterior leaf of the broad ligament then divided with the hot shears. The broad and cardinal ligaments were then cauterized on the left in the same way.   At the level of the internal os, the uterine arteries were bilaterally cauterized with the PK.  The ureters were identified well out of the field of dissection.  .   The bladder was then able to be retracted inferiorly and the vesico-uterine fascia was incised in the midline until the bladder was removed one cm below the Koh ring.  The hot shears then circumferentially incised  the vagina at the level of the reflection on the [facility] ring.  Once the uterus and cervix were amputated, cautery was used to insure hemostasis of the cuff.  Once hemostasis was achieved, the instruments were changed to the mega needle driver and mega suture cut needle driver and the cuff was closed with a running stitches of 0-vicryl V loc.    Cautery was used to ensure hemostasis of the left pedicles very superficially.  The ureters were peristalsing bilaterally well and very lateral to the areas of operation.    The Robot was then undocked and I scrubbed back in.  The needle was removed and Bupivicaine was introduced into the pelvis.  The fascia of the 12 mm trocar was closed with a figure of 8 stitch of 0 vicryl.  The skin incisions were closed with subcuticular stitches of 3-0 vicryl Rapide and Dermabond.  All instruments were removed from the vagina and cystoscopy performed, revealing an intact bladder and vigourous spill of indigo carmine from each ureteral orifice.  The cystoscope was removed and the patient taken to the recovery room in stable condition.  Mechell Girgis A

## 2016-04-19 NOTE — Anesthesia Procedure Notes (Signed)
Procedure Name: Intubation Date/Time: 04/19/2016 11:12 AM Performed by: Vernice Jefferson Pre-anesthesia Checklist: Patient identified, Emergency Drugs available, Suction available, Patient being monitored and Timeout performed Patient Re-evaluated:Patient Re-evaluated prior to inductionOxygen Delivery Method: Circle system utilized Preoxygenation: Pre-oxygenation with 100% oxygen Intubation Type: IV induction Ventilation: Mask ventilation without difficulty Grade View: Grade IV Tube type: Oral Tube size: 7.0 mm Number of attempts: 2 (per Dr Tresa Moore anterior & over bite) Airway Equipment and Method: Stylet Placement Confirmation: positive ETCO2 and breath sounds checked- equal and bilateral Secured at: 21 cm Tube secured with: Tape Dental Injury: Teeth and Oropharynx as per pre-operative assessment  Future Recommendations: Recommend- induction with short-acting agent, and alternative techniques readily available

## 2016-04-19 NOTE — Progress Notes (Signed)
There has been no change in the patients history, status or exam since the history and physical.  Filed Vitals:   04/19/16 0910  BP: 140/88  Pulse: 94  Temp: 98.5 F (36.9 C)  Resp: 20     Lab Results  Component Value Date   WBC 8.8 04/12/2016   HGB 12.4 04/12/2016   HCT 37.3 04/12/2016   MCV 91.9 04/12/2016   PLT 306 04/12/2016    Stevey Stapleton A

## 2016-04-19 NOTE — Anesthesia Postprocedure Evaluation (Signed)
Anesthesia Post Note  Patient: Caitlin Hansen  Procedure(s) Performed: Procedure(s) (LRB): ROBOTIC ASSISTED TOTAL HYSTERECTOMY WITH SALPINGECTOMY (Bilateral) CYSTOSCOPY (N/A)  Patient location during evaluation: [facility] Anesthesia Type: General Level of consciousness: awake and alert Pain management: satisfactory to patient Vital Signs Assessment: post-procedure vital signs reviewed and stable Respiratory status: spontaneous breathing, respiratory function stable and patient connected to nasal cannula oxygen Cardiovascular status: stable Postop Assessment: adequate PO intake Anesthetic complications: no     Last Vitals:  Filed Vitals:   04/19/16 1700 04/19/16 1810  BP: 128/77 139/74  Pulse: 97 106  Temp: 36.7 C 37.3 C  Resp: 18 16    Last Pain:  Filed Vitals:   04/19/16 2043  PainSc: 0-No pain   Pain Goal: Patients Stated Pain Goal: 5 (04/19/16 1750)               Senai Kingsley

## 2016-04-19 NOTE — Addendum Note (Signed)
Addendum  created 04/19/16 2055 by Flossie Dibble, CRNA   Modules edited: Notes Section   Notes Section:  File: [id number]

## 2016-04-19 NOTE — Transfer of Care (Signed)
Immediate Anesthesia Transfer of Care Note  Patient: Caitlin Hansen  Procedure(s) Performed: Procedure(s): ROBOTIC ASSISTED TOTAL HYSTERECTOMY WITH SALPINGECTOMY (Bilateral) CYSTOSCOPY (N/A)  Patient Location: PACU  Anesthesia Type:General  Level of Consciousness: awake and oriented  Airway & Oxygen Therapy: Patient Spontanous Breathing and Patient connected to nasal cannula oxygen  Post-op Assessment: Report given to RN and Post -op Vital signs reviewed and stable  Post vital signs: Reviewed and stable  Last Vitals:  Filed Vitals:   04/19/16 0910  BP: 140/88  Pulse: 94  Temp: 36.9 C  Resp: 20    Last Pain: There were no vitals filed for this visit.       Complications: No apparent anesthesia complications

## 2016-04-20 ENCOUNTER — Encounter (HOSPITAL_COMMUNITY): Payer: Self-pay | Admitting: Obstetrics and Gynecology

## 2016-04-20 DIAGNOSIS — N921 Excessive and frequent menstruation with irregular cycle: Secondary | ICD-10-CM | POA: Diagnosis not present

## 2016-04-20 MED ORDER — OXYCODONE-ACETAMINOPHEN 5-325 MG PO TABS: 1.0000 | ORAL_TABLET | ORAL | Status: DC | PRN

## 2016-04-20 NOTE — Progress Notes (Signed)
Discharge teaching complete. Pt understood all instructions and did not have any questions. Pt pushed via wheelchair and discharged home to family. 

## 2016-04-20 NOTE — Discharge Summary (Signed)
Physician Discharge Summary  Patient ID: Caitlin Hansen MRN: [id number] DOB/AGE: [birth year] 48 y.o.  Admit date: 04/19/2016 Discharge date: 04/20/2016  Admission Diagnoses:  Discharge Diagnoses:  Active Problems:   Postoperative state   Discharged Condition: good  Hospital Course: Uncomplicated Robotic TLH/Salpingectomies and cysto  Consults: None  Significant Diagnostic Studies: labs: normal preop  Treatments: surgery: Uncomplicated Robotic TLH/Salpingectomies and cysto  Discharge Exam: Blood pressure 120/70, pulse 72, temperature 97.9 F (36.6 C), temperature source Oral, resp. rate 18, height 5\' 5"  (1.651 m), weight 78.472 kg (173 lb), SpO2 100 %.   Disposition: 01-Home or Self Care  Discharge Instructions    Call MD for:  temperature >100.4    Complete by:  As directed      Diet - low sodium heart healthy    Complete by:  As directed      Discharge instructions    Complete by:  As directed   No driving on narcotics, no sexual activity for 2 weeks.     Increase activity slowly    Complete by:  As directed      May shower / Bathe    Complete by:  As directed   Shower, no bath for 2 weeks.     Remove dressing in 24 hours    Complete by:  As directed      Sexual Activity Restrictions    Complete by:  As directed   No sexual activity for 2 weeks.            Medication List    STOP taking these medications        DEPO-PROVERA 150 MG/ML injection  Generic drug:  medroxyPROGESTERone      TAKE these medications        ibuprofen 200 MG tablet  Commonly known as:  ADVIL,MOTRIN  Take 400 mg by mouth every 6 (six) hours as needed for moderate pain.     ONE-A-DAY WOMENS tablet  Take 1 tablet by mouth daily.     oxyCODONE-acetaminophen 5-325 MG tablet  Commonly known as:  PERCOCET/ROXICET  Take 1-2 tablets by mouth every 4 (four) hours as needed for severe pain (moderate to severe pain (when tolerating fluids)).           Follow-up Information    Follow up with Rani Sisney A, MD In 2 weeks.   Specialty:  Obstetrics and Gynecology   Contact information:   [address] [place] [address] [phone]       Signed: Homer Pfeifer A 04/20/2016, 7:12 AM

## 2016-04-20 NOTE — Progress Notes (Signed)
Patient is eating, ambulating, and voiding.  Pain control is good.  BP 120/70 mmHg  Pulse 72  Temp(Src) 97.9 F (36.6 C) (Oral)  Resp 18  Ht 5\' 5"  (1.651 m)  Wt 78.472 kg (173 lb)  BMI 28.79 kg/m2  SpO2 100%  lungs:   clear to auscultation cor:    RRR Abdomen:  soft, appropriate tenderness, incisions intact and without erythema or exudate. ex:    no cords   Lab Results  Component Value Date   WBC 8.8 04/12/2016   HGB 12.4 04/12/2016   HCT 37.3 04/12/2016   MCV 91.9 04/12/2016   PLT 306 04/12/2016    A/P  Routine care.  Expect d/c per plan.

## 2017-02-02 ENCOUNTER — Encounter (HOSPITAL_COMMUNITY): Payer: Self-pay | Admitting: Emergency Medicine

## 2017-02-02 ENCOUNTER — Ambulatory Visit (HOSPITAL_COMMUNITY)
Admission: EM | Admit: 2017-02-02 | Discharge: 2017-02-02 | Disposition: A | Discharge status: Home / Self Care | Payer: BLUE CROSS/BLUE SHIELD | Attending: Family Medicine | Admitting: Family Medicine

## 2017-02-02 DIAGNOSIS — R109 Unspecified abdominal pain: Secondary | ICD-10-CM | POA: Diagnosis not present

## 2017-02-02 DIAGNOSIS — N23 Unspecified renal colic: Secondary | ICD-10-CM

## 2017-02-02 DIAGNOSIS — Z79899 Other long term (current) drug therapy: Secondary | ICD-10-CM | POA: Insufficient documentation

## 2017-02-02 DIAGNOSIS — Z9889 Other specified postprocedural states: Secondary | ICD-10-CM | POA: Insufficient documentation

## 2017-02-02 DIAGNOSIS — M549 Dorsalgia, unspecified: Secondary | ICD-10-CM | POA: Diagnosis not present

## 2017-02-02 LAB — POCT URINALYSIS DIP (DEVICE)
BILIRUBIN URINE: NEGATIVE
GLUCOSE, UA: NEGATIVE mg/dL
Ketones, ur: NEGATIVE mg/dL
LEUKOCYTES UA: NEGATIVE
NITRITE: NEGATIVE
Protein, ur: NEGATIVE mg/dL
Specific Gravity, Urine: 1.02 (ref 1.005–1.030)
UROBILINOGEN UA: 2 mg/dL — AB (ref 0.0–1.0)
pH: 7 (ref 5.0–8.0)

## 2017-02-02 MED ORDER — TAMSULOSIN HCL 0.4 MG PO CAPS: 0.4000 mg | ORAL_CAPSULE | Freq: Every day | ORAL | 0 refills | Status: DC

## 2017-02-02 MED ORDER — OXYCODONE-ACETAMINOPHEN 5-325 MG PO TABS: 1.0000 | ORAL_TABLET | Freq: Four times a day (QID) | ORAL | 0 refills | Status: DC | PRN

## 2017-02-02 MED ORDER — HYDROCODONE-ACETAMINOPHEN 5-325 MG PO TABS
ORAL_TABLET | ORAL | Status: AC
  Filled 2017-02-02: qty 1

## 2017-02-02 MED ORDER — HYDROCODONE-ACETAMINOPHEN 5-325 MG PO TABS
1.0000 | ORAL_TABLET | Freq: Once | ORAL | Status: AC
  Administered 2017-02-02: 1 via ORAL

## 2017-02-02 NOTE — Discharge Instructions (Signed)
The presumptive diagnosis is renal colic or kidney stone likely somewhere in the ureter. Complications include blocking the tube that does not allow urine to flow, infection, increased pain. The first option is to go to the emergency department to have a CT urogram, IV fluids and pain medication. The other option is to treat the pain, drink plenty of fluids and take the medication prescribed and see your urologist on Monday. If at any time your pain becomes severe,  developed fever, chills or any signs of infection, have inability to urinate or worsening of symptoms in any other way to directly to the emergency department. Strain your urine.

## 2017-02-02 NOTE — ED Triage Notes (Addendum)
Patient stated lower back pain started today, little relief with Advil, feeling nausea. Caitlin Hansen, CMA student

## 2017-02-02 NOTE — ED Provider Notes (Signed)
CSN: [id number]     Arrival date & time 02/02/17  1651 History   None    Chief Complaint  Patient presents with  . Back Pain   (Consider location/radiation/quality/duration/timing/severity/associated sxs/prior Treatment) Pleasant 51 year old female complaining of right lower back / flank pain that started suddenly at 10:30 this morning while she was sitting at her desk. It is not necessarily worse with walking or movement. She did take Aleve which did help some. The pain tends to wax and wane. It can be mild, moderate to severe. She is also having urinary frequency, nausea and low volume voids. Denies dysuria. No history of similar type pain. No recent history of back injury, lifting or pulling or other activity that would have caused the pain with her recollection.       Past Medical History:  Diagnosis Date  . Depression   . History of thyroid disease    patient took medication, but no longer requires medication  . History of uterine fibroid    Per patient report  . Hyperglycemia 2012   Dr. Bobbye Charleston  . Knee pain, bilateral    aching, popping, "feels like they are going to give out"  . Placenta previa 1991   Past Surgical History:  Procedure Laterality Date  . BACK SURGERY  1996   cyst removal  . CESAREAN SECTION  08/15/90  . CYSTOSCOPY N/A 04/19/2016   Procedure: CYSTOSCOPY;  Surgeon: Bobbye Charleston, MD;  Location: [facility];  Service: Gynecology;  Laterality: N/A;  . ROBOTIC ASSISTED TOTAL HYSTERECTOMY WITH SALPINGECTOMY Bilateral 04/19/2016   Procedure: ROBOTIC ASSISTED TOTAL HYSTERECTOMY WITH SALPINGECTOMY;  Surgeon: Bobbye Charleston, MD;  Location: [facility];  Service: Gynecology;  Laterality: Bilateral;  . ROTATOR CUFF REPAIR    . WISDOM TOOTH EXTRACTION     Family History  Problem Relation Age of Onset  . Colon cancer Mother 39  . Colon cancer Maternal Uncle 35  . Colon cancer Maternal Grandmother   . Diabetes Maternal Aunt   . Diabetes Maternal Aunt   . Cancer  Other     Colon   Social History  Substance Use Topics  . Smoking status: Never Smoker  . Smokeless tobacco: Never Used  . Alcohol use 0.0 oz/week     Comment: Rare; social drinker   OB History    No data available     Review of Systems  Constitutional: Positive for activity change.  HENT: Negative.   Respiratory: Negative.  Negative for cough and shortness of breath.   Cardiovascular: Negative for chest pain.  Gastrointestinal: Positive for nausea. Negative for abdominal pain and vomiting.  Genitourinary: Positive for frequency and urgency. Negative for dysuria, vaginal bleeding and vaginal discharge.  Musculoskeletal: Positive for back pain. Negative for neck pain.  Skin: Negative.   Neurological: Negative.   Psychiatric/Behavioral: Negative.   All other systems reviewed and are negative.   Allergies  Sulfamethoxazole-trimethoprim  Home Medications   Prior to Admission medications   Medication Sig Start Date End Date Taking? Authorizing Provider  ibuprofen (ADVIL,MOTRIN) 200 MG tablet Take 400 mg by mouth every 6 (six) hours as needed for moderate pain.    Historical Provider, MD  Multiple Vitamins-Calcium (ONE-A-DAY WOMENS) tablet Take 1 tablet by mouth daily.      Historical Provider, MD  oxyCODONE-acetaminophen (PERCOCET/ROXICET) 5-325 MG tablet Take 1-2 tablets by mouth every 6 (six) hours as needed for severe pain. 02/02/17   Janne Napoleon, NP  tamsulosin (FLOMAX) 0.4 MG CAPS capsule Take 1 capsule (  0.4 mg total) by mouth at bedtime. 02/02/17   Janne Napoleon, NP   Meds Ordered and Administered this Visit   Medications  HYDROcodone-acetaminophen (NORCO/VICODIN) 5-325 MG per tablet 1 tablet (1 tablet Oral Given 02/02/17 1810)    BP 131/82 (BP Location: Right Arm)   Temp 97.8 F (36.6 C) (Oral)   Resp 18   LMP 09/02/2013   SpO2 99%  No data found.   Physical Exam  Constitutional: She is oriented to person, place, and time. She appears well-developed and well-nourished.   HENT:  Head: Normocephalic and atraumatic.  Eyes: EOM are normal.  Neck: Normal range of motion. Neck supple.  Cardiovascular: Normal rate and regular rhythm.   Pulmonary/Chest: Effort normal and breath sounds normal.  Abdominal: Soft. There is no tenderness.  Musculoskeletal: Normal range of motion. She exhibits no edema.  Patient is able to flex forward 90 without causing pain. Palpation does not reproduce pain but does identify the area in the right flank with pain. Positive for right CVAT, or percussion tenderness. No spinal tenderness.  Neurological: She is alert and oriented to person, place, and time.  Skin: Skin is warm and dry.  Psychiatric: She has a normal mood and affect.  Nursing note and vitals reviewed.   Urgent Care Course     Procedures (including critical care time)  Labs Review Labs Reviewed  POCT URINALYSIS DIP (DEVICE) - Abnormal; Notable for the following:       Result Value   Hgb urine dipstick MODERATE (*)    Urobilinogen, UA 2.0 (*)    All other components within normal limits  URINE CULTURE    Imaging Review No results found.   Visual Acuity Review  Right Eye Distance:   Left Eye Distance:   Bilateral Distance:    Right Eye Near:   Left Eye Near:    Bilateral Near:         MDM   1. Flank pain   2. Renal colic on right side    The presumptive diagnosis is renal colic or kidney stone likely somewhere in the ureter. Complications include blocking the tube that does not allow urine to flow, infection, increased pain. The first option is to go to the emergency department to have a CT urogram, IV fluids and pain medication. The other option is to treat the pain, drink plenty of fluids and take the medication prescribed and see your urologist on Monday. If at any time your pain becomes severe,  developed fever, chills or any signs of infection, have inability to urinate or worsening of symptoms in any other way to directly to the emergency  department. Strain your urine. Patient elected not to go to the emergency department. She realizes there may be complications as above. She agrees to go to the emergency Department for any complications or worsening. She states she understands her instructions and will comply. She also knows that she must see her urologist on Monday evening she feels better. Meds ordered this encounter  Medications  . HYDROcodone-acetaminophen (NORCO/VICODIN) 5-325 MG per tablet 1 tablet  . oxyCODONE-acetaminophen (PERCOCET/ROXICET) 5-325 MG tablet    Sig: Take 1-2 tablets by mouth every 6 (six) hours as needed for severe pain.    Dispense:  20 tablet    Refill:  0    Order Specific Question:   Supervising Provider    Answer:   Robyn Haber [[id number]]  . tamsulosin (FLOMAX) 0.4 MG CAPS capsule    Sig: Take 1 capsule (  0.4 mg total) by mouth at bedtime.    Dispense:  14 capsule    Refill:  0    Order Specific Question:   Supervising Provider    Answer:   Robyn Haber [[id number]]       Janne Napoleon, NP 02/02/17 1824

## 2017-02-05 ENCOUNTER — Telehealth (HOSPITAL_COMMUNITY): Payer: Self-pay | Admitting: Internal Medicine

## 2017-02-05 LAB — URINE CULTURE: Culture: 40000 — AB

## 2017-02-05 MED ORDER — NITROFURANTOIN MONOHYD MACRO 100 MG PO CAPS: 100.0000 mg | ORAL_CAPSULE | Freq: Two times a day (BID) | ORAL | 0 refills | Status: DC

## 2017-02-05 NOTE — Telephone Encounter (Signed)
Clinical staff, please let patient know that urine culture was positive for E coli germ.  The significance of this finding is unclear, as her clinical presentation was more consistent with kidney stone than with UTI.   Will send rx for nitrofurantoin to pharmacy of record, [facility] on [address].  Followup with urologist as discussed at [facility] visit 01/02/17.  LM

## 2017-02-20 ENCOUNTER — Other Ambulatory Visit: Payer: Self-pay | Admitting: Internal Medicine

## 2017-02-20 DIAGNOSIS — E010 Iodine-deficiency related diffuse (endemic) goiter: Secondary | ICD-10-CM

## 2017-02-23 ENCOUNTER — Other Ambulatory Visit: Payer: Self-pay | Admitting: Obstetrics and Gynecology

## 2017-02-26 ENCOUNTER — Other Ambulatory Visit: Payer: Self-pay | Admitting: Obstetrics and Gynecology

## 2017-02-26 DIAGNOSIS — R928 Other abnormal and inconclusive findings on diagnostic imaging of breast: Secondary | ICD-10-CM

## 2017-02-26 LAB — CYTOLOGY - PAP

## 2017-03-23 ENCOUNTER — Ambulatory Visit
Admission: RE | Admit: 2017-03-23 | Discharge: 2017-03-23 | Disposition: A | Discharge status: Home / Self Care | Payer: BLUE CROSS/BLUE SHIELD | Source: Ambulatory Visit | Attending: Obstetrics and Gynecology | Admitting: Obstetrics and Gynecology

## 2017-03-23 ENCOUNTER — Other Ambulatory Visit: Payer: Self-pay | Admitting: Obstetrics and Gynecology

## 2017-03-23 ENCOUNTER — Ambulatory Visit
Admission: RE | Admit: 2017-03-23 | Discharge: 2017-03-23 | Disposition: A | Discharge status: Home / Self Care | Payer: BLUE CROSS/BLUE SHIELD | Source: Ambulatory Visit | Attending: Internal Medicine | Admitting: Internal Medicine

## 2017-03-23 DIAGNOSIS — R928 Other abnormal and inconclusive findings on diagnostic imaging of breast: Secondary | ICD-10-CM

## 2017-03-23 DIAGNOSIS — E010 Iodine-deficiency related diffuse (endemic) goiter: Secondary | ICD-10-CM

## 2017-03-23 DIAGNOSIS — N6320 Unspecified lump in the left breast, unspecified quadrant: Secondary | ICD-10-CM

## 2017-03-30 ENCOUNTER — Ambulatory Visit
Admission: RE | Admit: 2017-03-30 | Discharge: 2017-03-30 | Disposition: A | Discharge status: Home / Self Care | Payer: BLUE CROSS/BLUE SHIELD | Source: Ambulatory Visit | Attending: Obstetrics and Gynecology | Admitting: Obstetrics and Gynecology

## 2017-03-30 ENCOUNTER — Other Ambulatory Visit: Payer: Self-pay | Admitting: Obstetrics and Gynecology

## 2017-03-30 DIAGNOSIS — N6320 Unspecified lump in the left breast, unspecified quadrant: Secondary | ICD-10-CM

## 2017-03-30 DIAGNOSIS — N6002 Solitary cyst of left breast: Secondary | ICD-10-CM

## 2018-02-08 DIAGNOSIS — E039 Hypothyroidism, unspecified: Secondary | ICD-10-CM | POA: Diagnosis not present

## 2018-02-08 DIAGNOSIS — E663 Overweight: Secondary | ICD-10-CM | POA: Diagnosis not present

## 2018-02-08 DIAGNOSIS — Z0000 Encounter for general adult medical examination without abnormal findings: Secondary | ICD-10-CM | POA: Diagnosis not present

## 2018-02-08 DIAGNOSIS — R7301 Impaired fasting glucose: Secondary | ICD-10-CM | POA: Diagnosis not present

## 2018-02-15 ENCOUNTER — Other Ambulatory Visit: Payer: Self-pay | Admitting: Internal Medicine

## 2018-02-15 DIAGNOSIS — M25561 Pain in right knee: Secondary | ICD-10-CM | POA: Diagnosis not present

## 2018-02-15 DIAGNOSIS — R1310 Dysphagia, unspecified: Secondary | ICD-10-CM | POA: Diagnosis not present

## 2018-02-15 DIAGNOSIS — Z0000 Encounter for general adult medical examination without abnormal findings: Secondary | ICD-10-CM | POA: Diagnosis not present

## 2018-02-15 DIAGNOSIS — E041 Nontoxic single thyroid nodule: Secondary | ICD-10-CM

## 2018-02-15 DIAGNOSIS — R319 Hematuria, unspecified: Secondary | ICD-10-CM | POA: Diagnosis not present

## 2018-02-25 ENCOUNTER — Ambulatory Visit
Admission: RE | Admit: 2018-02-25 | Discharge: 2018-02-25 | Disposition: A | Discharge status: Home / Self Care | Payer: BLUE CROSS/BLUE SHIELD | Source: Ambulatory Visit | Attending: Internal Medicine | Admitting: Internal Medicine

## 2018-02-25 DIAGNOSIS — E041 Nontoxic single thyroid nodule: Secondary | ICD-10-CM

## 2018-03-11 ENCOUNTER — Ambulatory Visit (INDEPENDENT_AMBULATORY_CARE_PROVIDER_SITE_OTHER): Payer: BLUE CROSS/BLUE SHIELD | Admitting: Internal Medicine

## 2018-03-11 ENCOUNTER — Encounter: Payer: Self-pay | Admitting: Internal Medicine

## 2018-03-11 VITALS — BP 102/68 | HR 72 | Ht 65.0 in | Wt 165.5 lb

## 2018-03-11 DIAGNOSIS — R1013 Epigastric pain: Secondary | ICD-10-CM | POA: Diagnosis not present

## 2018-03-11 DIAGNOSIS — Z800 Family history of malignant neoplasm of digestive organs: Secondary | ICD-10-CM

## 2018-03-11 DIAGNOSIS — K219 Gastro-esophageal reflux disease without esophagitis: Secondary | ICD-10-CM

## 2018-03-11 DIAGNOSIS — K5900 Constipation, unspecified: Secondary | ICD-10-CM | POA: Diagnosis not present

## 2018-03-11 DIAGNOSIS — R109 Unspecified abdominal pain: Secondary | ICD-10-CM

## 2018-03-11 NOTE — Patient Instructions (Signed)
You have been scheduled for an endoscopy and colonoscopy. Please follow the written instructions given to you at your visit today. Please pick up your prep supplies at the pharmacy within the next 1-3 days. If you use inhalers (even only as needed), please bring them with you on the day of your procedure. Your physician has requested that you go to www.startemmi.com and enter the access code given to you at your visit today. This web site gives a general overview about your procedure. However, you should still follow specific instructions given to you by our office regarding your preparation for the procedure.   Take 2 tablespoons of Miralax daily

## 2018-03-11 NOTE — Progress Notes (Signed)
HISTORY OF PRESENT ILLNESS:  Caitlin Hansen is a 52 y.o. female , [employer] employee, who is referred by Dr. Ashby Dawes regarding multiple GI complaints over the past year. Outside office notes labs and x-rays reviewed. No remarkable abnormalities. The patient has a strong history of colon cancer in her mother at age 6 and more recently her brother and 2017. The patient underwent colonoscopy in 2009 and again in 2015. The examinations were normal. 18 follow-up in 5 years recommended. The patient states that about one year ago she started noticing postprandial abdominal discomfort. Fullness or bloating sensation. Progressive problems with regurgitation. Occasional coughing or choking but no true esophageal dysphagia. Issues with constipation for which she had been using Dulcolax periodically. Also increased intestinal gas. She is concerned about her change in bowel habits particular given her family history. No bleeding. No weight loss. She was having some reflux symptoms for which she was placed on omeprazole 20 mg daily approximately 1 month ago. She is not using oxycodone.  REVIEW OF SYSTEMS:  All non-GI ROS negative unless otherwise stated in the history of present illness except for sinus allergy, arthritis, nose bleeds, night sweats, itching, hearing problems, sleeping problems, excessive thirst, excessive urination  Past Medical History:  Diagnosis Date  . Allergic rhinitis   . Depression   . GERD (gastroesophageal reflux disease)   . History of thyroid disease    patient took medication, but no longer requires medication  . History of uterine fibroid    Per patient report  . Hyperglycemia 2012   Dr. Bobbye Charleston  . Iron deficiency anemia   . Kidney stone   . Knee pain, bilateral    aching, popping, "feels like they are going to give out"  . Placenta previa 1991    Past Surgical History:  Procedure Laterality Date  . BACK SURGERY  1996   cyst removal  .  CESAREAN SECTION  08/15/90  . CYSTOSCOPY N/A 04/19/2016   Procedure: CYSTOSCOPY;  Surgeon: Bobbye Charleston, MD;  Location: [facility];  Service: Gynecology;  Laterality: N/A;  . ROBOTIC ASSISTED TOTAL HYSTERECTOMY WITH SALPINGECTOMY Bilateral 04/19/2016   Procedure: ROBOTIC ASSISTED TOTAL HYSTERECTOMY WITH SALPINGECTOMY;  Surgeon: Bobbye Charleston, MD;  Location: [facility];  Service: Gynecology;  Laterality: Bilateral;  . ROTATOR CUFF REPAIR    . WISDOM TOOTH EXTRACTION      Social History Caitlin Hansen  reports that  has never smoked. she has never used smokeless tobacco. She reports that she drinks alcohol. She reports that she does not use drugs.  family history includes Cancer in her other; Colon cancer in her maternal grandmother; Colon cancer (age of onset: 46) in her mother; Colon cancer (age of onset: 61) in her maternal uncle; Diabetes in her maternal aunt and maternal aunt.  Allergies  Allergen Reactions  . Sulfamethoxazole-Trimethoprim Other (See Comments)    REACTION: vaginal itching       PHYSICAL EXAMINATION: Vital signs: BP 102/68   Pulse 72   Ht 5\' 5"  (1.651 m)   Wt 165 lb 8 oz (75.1 kg)   LMP 09/02/2013   BMI 27.54 kg/m   Constitutional: generally well-appearing, no acute distress Psychiatric: alert and oriented x3, cooperative Eyes: extraocular movements intact, anicteric, conjunctiva pink Mouth: oral pharynx moist, no lesions Neck: supple no lymphadenopathy Cardiovascular: heart regular rate and rhythm, no murmur Lungs: clear to auscultation bilaterally Abdomen: soft, nontender, nondistended, no obvious ascites, no peritoneal signs, normal bowel sounds, no organomegaly Rectal:deferred until  upcoming colonoscopy Extremities: no clubbing, cyanosis, or lower extremity edema bilaterally Skin: no lesions on visible extremities Neuro: No focal deficits. Cranial nerves intact  ASSESSMENT:  #1. Dyspepsia with postprandial fullness and regurgitation. Possible GERD.  Associated abdominal discomfort #2. New onset constipation #3. Strong family history of colon cancer in 2 first-degree relatives less than age 36   PLAN:  #1. Reflux precautions #2. Continue omeprazole 20 mg daily #3. Initiate MiraLAX 1-2 scoops daily #4. Schedule colonoscopy to evaluate abrupt change in bowel habits, abdominal complaints, and strong family history.The nature of the procedure, as well as the risks, benefits, and alternatives were carefully and thoroughly reviewed with the patient. Ample time for discussion and questions allowed. The patient understood, was satisfied, and agreed to proceed. #5. Schedule upper endoscopy to evaluate dyspeptic upper abdominal complaints.The nature of the procedure, as well as the risks, benefits, and alternatives were carefully and thoroughly reviewed with the patient. Ample time for discussion and questions allowed. The patient understood, was satisfied, and agreed to proceed.  A copy of this consultation note has been sent to Dr. Ashby Dawes

## 2018-03-13 ENCOUNTER — Other Ambulatory Visit: Payer: Self-pay

## 2018-03-13 ENCOUNTER — Ambulatory Visit (AMBULATORY_SURGERY_CENTER): Payer: BLUE CROSS/BLUE SHIELD | Admitting: Internal Medicine

## 2018-03-13 ENCOUNTER — Encounter: Payer: Self-pay | Admitting: Internal Medicine

## 2018-03-13 VITALS — BP 105/62 | HR 78 | Temp 96.9°F | Resp 10 | Ht 65.0 in | Wt 165.0 lb

## 2018-03-13 DIAGNOSIS — D120 Benign neoplasm of cecum: Secondary | ICD-10-CM | POA: Diagnosis not present

## 2018-03-13 DIAGNOSIS — R1013 Epigastric pain: Secondary | ICD-10-CM | POA: Diagnosis not present

## 2018-03-13 DIAGNOSIS — R109 Unspecified abdominal pain: Secondary | ICD-10-CM | POA: Diagnosis not present

## 2018-03-13 DIAGNOSIS — Z800 Family history of malignant neoplasm of digestive organs: Secondary | ICD-10-CM

## 2018-03-13 DIAGNOSIS — R194 Change in bowel habit: Secondary | ICD-10-CM

## 2018-03-13 DIAGNOSIS — K5900 Constipation, unspecified: Secondary | ICD-10-CM | POA: Diagnosis not present

## 2018-03-13 MED ORDER — SODIUM CHLORIDE 0.9 % IV SOLN: 500.0000 mL | Freq: Once | INTRAVENOUS | Status: AC

## 2018-03-13 NOTE — Patient Instructions (Addendum)
YOU HAD AN ENDOSCOPIC PROCEDURE TODAY AT [facility]:   Refer to the procedure report that was given to you for any specific questions about what was found during the examination.  If the procedure report does not answer your questions, please call your gastroenterologist to clarify.  If you requested that your care partner not be given the details of your procedure findings, then the procedure report has been included in a sealed envelope for you to review at your convenience later.  YOU SHOULD EXPECT: Some feelings of bloating in the abdomen. Passage of more gas than usual.  Walking can help get rid of the air that was put into your GI tract during the procedure and reduce the bloating. If you had a lower endoscopy (such as a colonoscopy or flexible sigmoidoscopy) you may notice spotting of blood in your stool or on the toilet paper. If you underwent a bowel prep for your procedure, you may not have a normal bowel movement for a few days.  Please Note:  You might notice some irritation and congestion in your nose or some drainage.  This is from the oxygen used during your procedure.  There is no need for concern and it should clear up in a day or so.  SYMPTOMS TO REPORT IMMEDIATELY:   Following lower endoscopy (colonoscopy or flexible sigmoidoscopy):  Excessive amounts of blood in the stool  Significant tenderness or worsening of abdominal pains  Swelling of the abdomen that is new, acute  Fever of 100F or higher   Following upper endoscopy (EGD)  Vomiting of blood or coffee ground material  New chest pain or pain under the shoulder blades  Painful or persistently difficult swallowing  New shortness of breath  Fever of 100F or higher  Black, tarry-looking stools  For urgent or emergent issues, a gastroenterologist can be reached at any hour by calling [phone].   DIET:  We do recommend a small meal at first, but then you may proceed to your regular diet.  Drink  plenty of fluids but you should avoid alcoholic beverages for 24 hours.  ACTIVITY:  You should plan to take it easy for the rest of today and you should NOT DRIVE or use heavy machinery until tomorrow (because of the sedation medicines used during the test).    FOLLOW UP: Our staff will call the number listed on your records the next business day following your procedure to check on you and address any questions or concerns that you may have regarding the information given to you following your procedure. If we do not reach you, we will leave a message.  However, if you are feeling well and you are not experiencing any problems, there is no need to return our call.  We will assume that you have returned to your regular daily activities without incident.  If any biopsies were taken you will be contacted by phone or by letter within the next 1-3 weeks.  Please call us at [phone] if you have not heard about the biopsies in 3 weeks.    SIGNATURES/CONFIDENTIALITY: You and/or your care partner have signed paperwork which will be entered into your electronic medical record.  These signatures attest to the fact that that the information above on your After Visit Summary has been reviewed and is understood.  Full responsibility of the confidentiality of this discharge information lies with you and/or your care-partner.   Resume medications. Information given on polyps.Follow up with Dr.Perry in  office in 3 months.

## 2018-03-13 NOTE — Op Note (Signed)
[facility] [place] Patient Name: Caitlin Hansen Procedure Date: 03/13/2018 8:05 AM MRN: [id number] Endoscopist: Docia Chuck. Henrene Pastor , MD Age: 52 Referring MD:  Date of Birth: 11-18-1966 Gender: Female Account #: [id number] Procedure:                Upper GI endoscopy Indications:              Dyspepsia, Abdominal bloating Medicines:                Monitored Anesthesia Care Procedure:                Pre-Anesthesia Assessment:                           - Prior to the procedure, a History and Physical                            was performed, and patient medications and                            allergies were reviewed. The patient's tolerance of                            previous anesthesia was also reviewed. The risks                            and benefits of the procedure and the sedation                            options and risks were discussed with the patient.                            All questions were answered, and informed consent                            was obtained. Prior Anticoagulants: The patient has                            taken no previous anticoagulant or antiplatelet                            agents. ASA Grade Assessment: II - A patient with                            mild systemic disease. After reviewing the risks                            and benefits, the patient was deemed in                            satisfactory condition to undergo the procedure.                           After obtaining informed consent, the endoscope was  passed under direct vision. Throughout the                            procedure, the patient's blood pressure, pulse, and                            oxygen saturations were monitored continuously. The                            Endoscope was introduced through the mouth, and                            advanced to the second part of duodenum. The upper                            GI endoscopy was  accomplished without difficulty.                            The patient tolerated the procedure well. Scope In: Scope Out: Findings:                 The esophagus was normal.                           The stomach was normal.                           The examined duodenum was normal.                           The cardia and gastric fundus were normal on                            retroflexion. Complications:            No immediate complications. Estimated Blood Loss:     Estimated blood loss: none. Impression:               - Normal esophagus.                           - Normal stomach.                           - Normal examined duodenum.                           - No specimens collected. Recommendation:           - Patient has a contact number available for                            emergencies. The signs and symptoms of potential                            delayed complications were discussed with the  patient. Return to normal activities tomorrow.                            Written discharge instructions were provided to the                            patient.                           - Resume previous diet.                           - Continue present medications. Okay to stop                            omeprazole (Prilosec) after completing current                            prescription                           - Make an office follow-up appointment with Dr.                            Henrene Pastor for about 3 months for routine checkup. Docia Chuck. Henrene Pastor, MD 03/13/2018 9:09:38 AM This report has been signed electronically.

## 2018-03-13 NOTE — Progress Notes (Signed)
Report to PACU, RN, vss, BBS= Clear.  

## 2018-03-13 NOTE — Progress Notes (Signed)
Called to room to assist during endoscopic procedure.  Patient ID and intended procedure confirmed with present staff. Received instructions for my participation in the procedure from the performing physician.  

## 2018-03-13 NOTE — Op Note (Signed)
[place] Patient Name: Lady Wisham Procedure Date: 03/13/2018 8:06 AM MRN: [id number] Endoscopist: Docia Chuck. Henrene Pastor , MD Age: 52 Referring MD:  Date of Birth: 12/31/1965 Gender: Female Account #: [id number] Procedure:                Colonoscopy With cold snare polypectomy x 1 Indications:              Change in bowel habits. Family history of colon                            cancer with 2 first-degree relatives less than age                            50. Normal examinations 2009 and 2015 Medicines:                Monitored Anesthesia Care Procedure:                Pre-Anesthesia Assessment:                           - Prior to the procedure, a History and Physical                            was performed, and patient medications and                            allergies were reviewed. The patient's tolerance of                            previous anesthesia was also reviewed. The risks                            and benefits of the procedure and the sedation                            options and risks were discussed with the patient.                            All questions were answered, and informed consent                            was obtained. Prior Anticoagulants: The patient has                            taken no previous anticoagulant or antiplatelet                            agents. ASA Grade Assessment: II - A patient with                            mild systemic disease. After reviewing the risks                            and benefits, the patient was deemed in  satisfactory condition to undergo the procedure.                           After obtaining informed consent, the colonoscope                            was passed under direct vision. Throughout the                            procedure, the patient's blood pressure, pulse, and                            oxygen saturations were monitored continuously. The           Model CF-HQ190L [id number]) scope was introduced                            through the anus and advanced to the the cecum,                            identified by appendiceal orifice and ileocecal                            valve. The ileocecal valve, appendiceal orifice,                            and rectum were photographed. The quality of the                            bowel preparation was excellent. The colonoscopy                            was performed without difficulty. The patient                            tolerated the procedure well. The bowel preparation                            used was SUPREP. Scope In: 8:36:48 AM Scope Out: 8:52:27 AM Scope Withdrawal Time: 0 hours 12 minutes 26 seconds  Total Procedure Duration: 0 hours 15 minutes 39 seconds  Findings:                 A 10 mm polyp was found in the cecum. The polyp was                            semi-pedunculated. The polyp was removed with a                            cold snare. Resection and retrieval were complete.                           The exam was otherwise without abnormality on  direct and retroflexion views. Complications:            No immediate complications. Estimated blood loss:                            None. Estimated Blood Loss:     Estimated blood loss: none. Impression:               - One 10 mm polyp in the cecum, removed with a cold                            snare. Resected and retrieved.                           - The examination was otherwise normal on direct                            and retroflexion views. Recommendation:           - Repeat colonoscopy in 3 years for surveillance.                           - Patient has a contact number available for                            emergencies. The signs and symptoms of potential                            delayed complications were discussed with the                            patient. Return to normal  activities tomorrow.                            Written discharge instructions were provided to the                            patient.                           - Resume previous diet.                           - Continue present medications. Okay to use MiraLAX                            for constipation.                           - Await pathology results. Docia Chuck. Henrene Pastor, MD 03/13/2018 9:07:20 AM This report has been signed electronically.

## 2018-03-14 ENCOUNTER — Telehealth: Payer: Self-pay

## 2018-03-14 NOTE — Telephone Encounter (Signed)
  Follow up Call-  Call back number 03/13/2018  Post procedure Call Back phone  # [phone]  Permission to leave phone message Yes  Some recent data might be hidden     Patient questions:  Do you have a fever, pain , or abdominal swelling? No. Pain Score  0 *  Have you tolerated food without any problems? Yes.    Have you been able to return to your normal activities? Yes.    Do you have any questions about your discharge instructions: Diet   No. Medications  No. Follow up visit  No.  Do you have questions or concerns about your Care? No.  Actions: * If pain score is 4 or above: No action needed, pain <4.

## 2018-03-19 ENCOUNTER — Encounter: Payer: Self-pay | Admitting: Internal Medicine

## 2018-03-27 DIAGNOSIS — Z1231 Encounter for screening mammogram for malignant neoplasm of breast: Secondary | ICD-10-CM | POA: Diagnosis not present

## 2018-03-27 DIAGNOSIS — Z8049 Family history of malignant neoplasm of other genital organs: Secondary | ICD-10-CM | POA: Diagnosis not present

## 2018-03-27 DIAGNOSIS — Z86010 Personal history of colonic polyps: Secondary | ICD-10-CM | POA: Diagnosis not present

## 2018-03-27 DIAGNOSIS — Z6827 Body mass index (BMI) 27.0-27.9, adult: Secondary | ICD-10-CM | POA: Diagnosis not present

## 2018-03-27 DIAGNOSIS — Z8371 Family history of colonic polyps: Secondary | ICD-10-CM | POA: Diagnosis not present

## 2018-03-27 DIAGNOSIS — Z01419 Encounter for gynecological examination (general) (routine) without abnormal findings: Secondary | ICD-10-CM | POA: Diagnosis not present

## 2018-03-27 DIAGNOSIS — Z800 Family history of malignant neoplasm of digestive organs: Secondary | ICD-10-CM | POA: Diagnosis not present

## 2018-06-05 ENCOUNTER — Ambulatory Visit: Payer: BLUE CROSS/BLUE SHIELD | Admitting: Internal Medicine

## 2018-07-05 DIAGNOSIS — Z6827 Body mass index (BMI) 27.0-27.9, adult: Secondary | ICD-10-CM | POA: Diagnosis not present

## 2018-07-05 DIAGNOSIS — Z7183 Encounter for nonprocreative genetic counseling: Secondary | ICD-10-CM | POA: Diagnosis not present

## 2018-10-17 DIAGNOSIS — Z23 Encounter for immunization: Secondary | ICD-10-CM | POA: Diagnosis not present

## 2019-02-19 IMAGING — US US BREAST BX W LOC DEV 1ST LESION IMG BX SPEC US GUIDE*L*
1 series · 13 of 13 positions shown · non-contrast
Comparison: Previous exam(s).

ADDENDUM:
Pathology revealed a fibroadenoma in the left breast. This was found
to be concordant by Dr. Ava Ling. Pathology results were
discussed with the patient by telephone. The patient reported doing
well after the biopsy. Post biopsy instructions and care were
reviewed and questions were answered. The patient was encouraged to
call The [REDACTED] for any additional
concerns. The patient was instructed to return for annual screening
mammography at [HOSPITAL] OB/[HOSPITAL] and informed that a
reminder letter would be sent regarding this appointment.

Pathology results reported by Quirijn Amazigh RN, BSN on 04/02/2017.
CLINICAL DATA: Left breast mass
EXAM:
ULTRASOUND GUIDED LEFT BREAST CORE NEEDLE BIOPSY

[Series 3: us breast bx w loc dev 1st lesion img bx spec us g · 0.07mm/px · 13 of 13 slices shown]
[im 1/13]
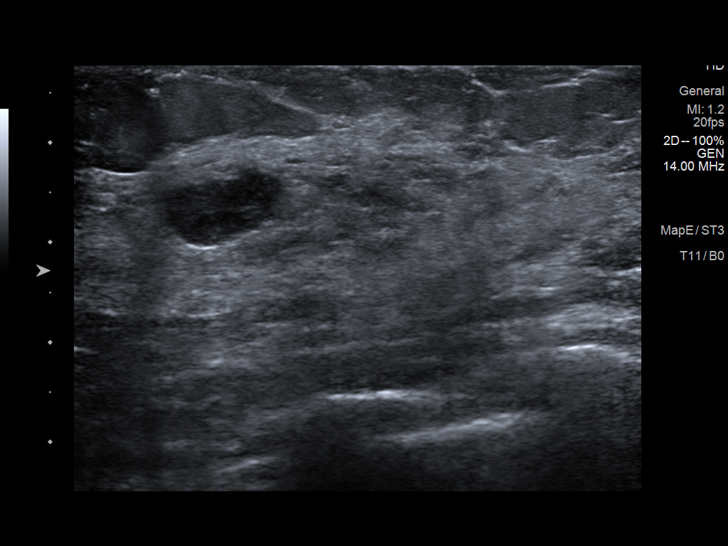
[im 2/13]
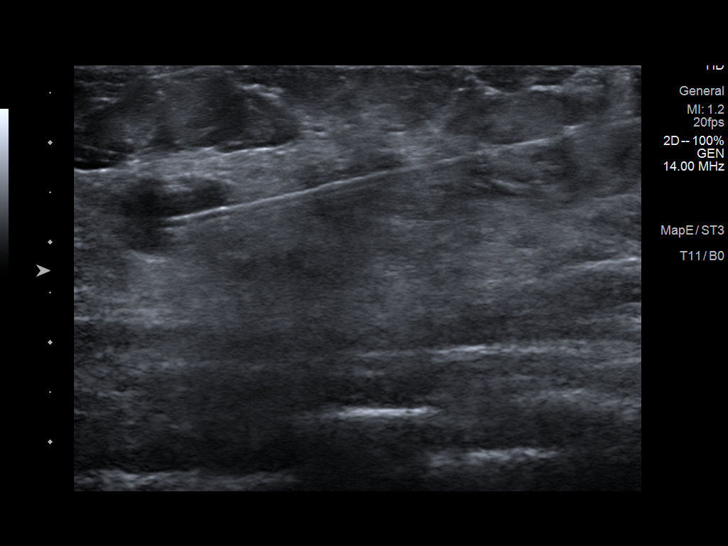
[im 3/13]
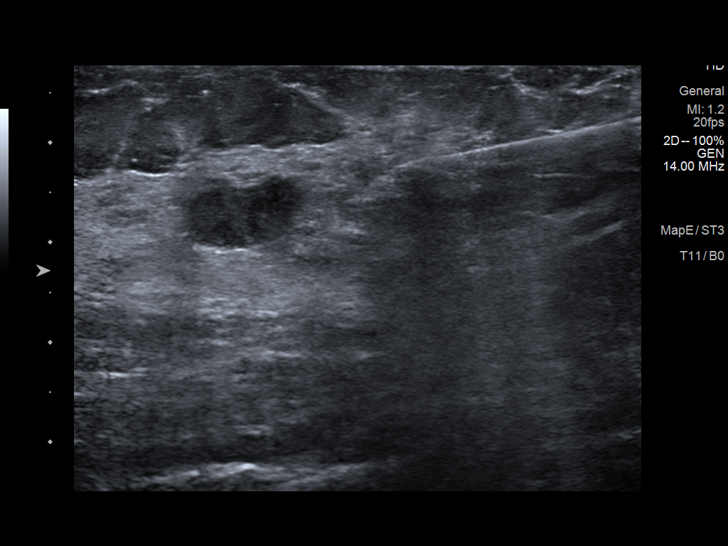
[im 4/13]
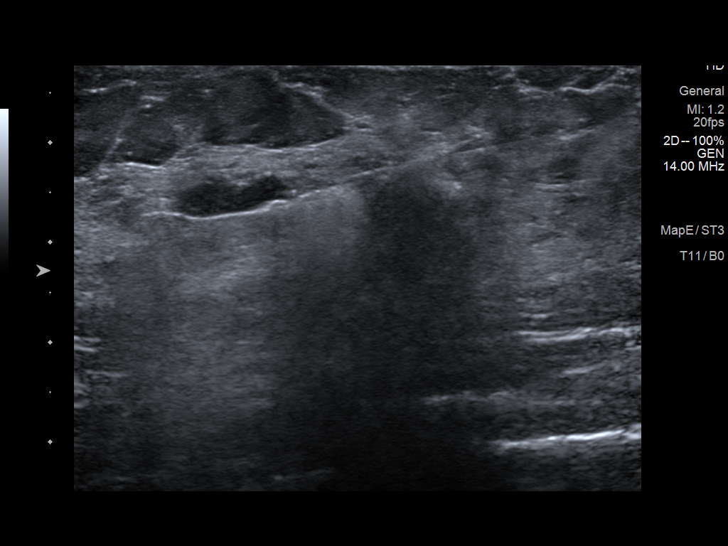
[im 5/13]
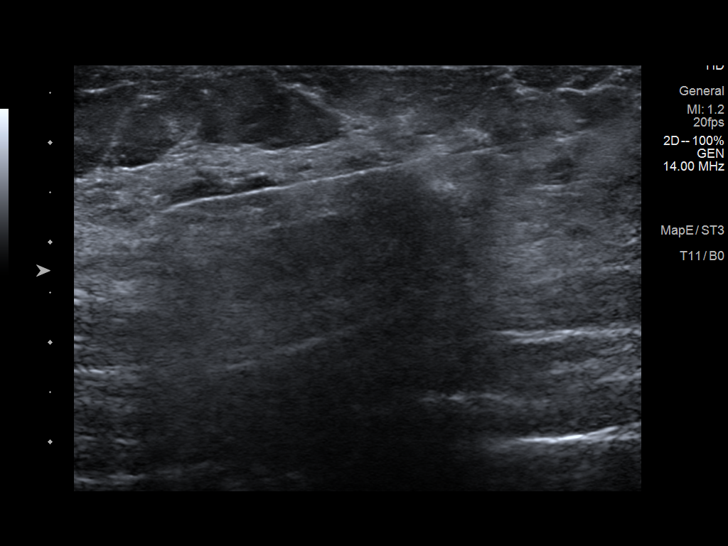
[im 6/13]
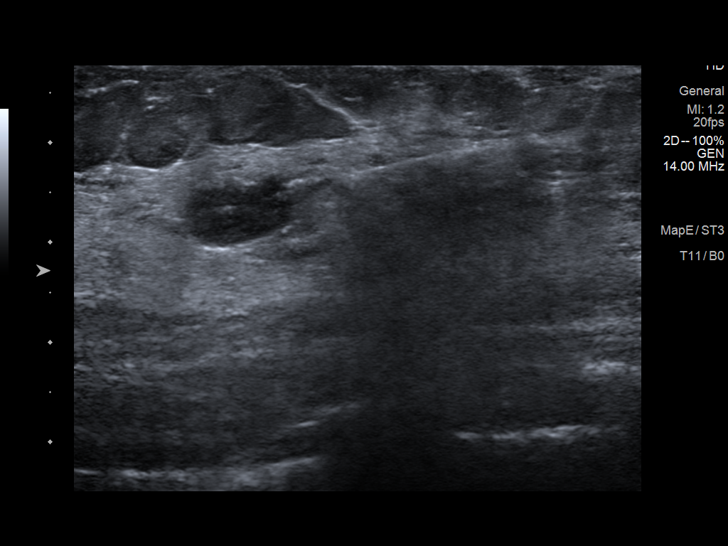
[im 7/13]
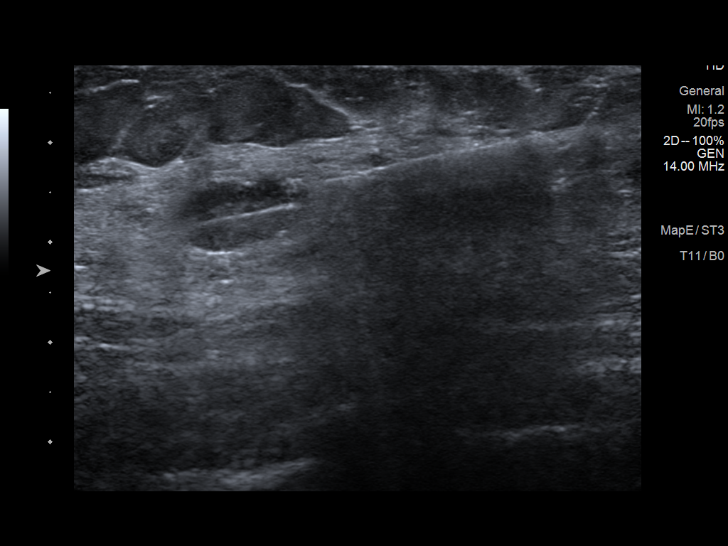
[im 8/13]
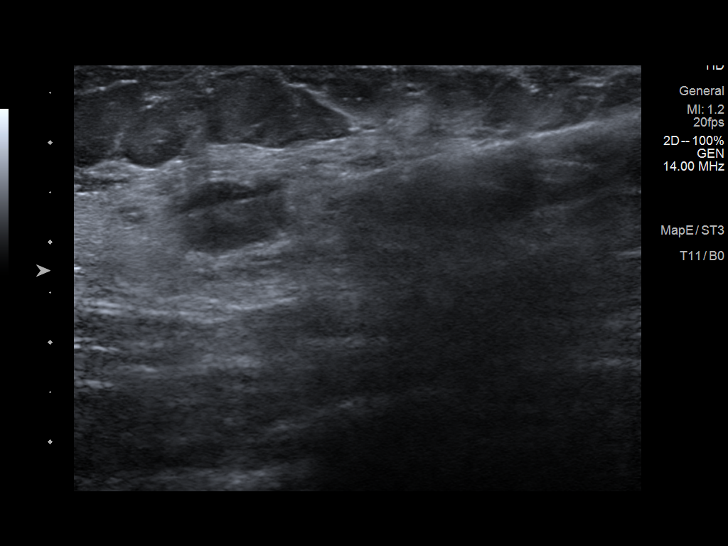
[im 9/13]
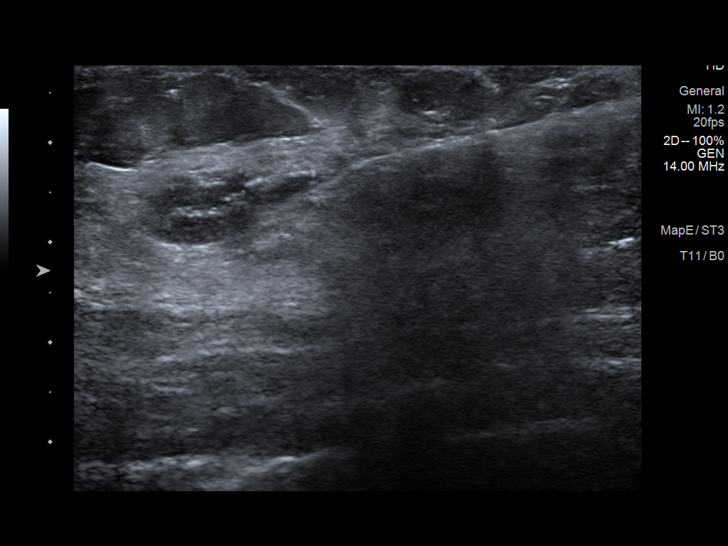
[im 10/13]
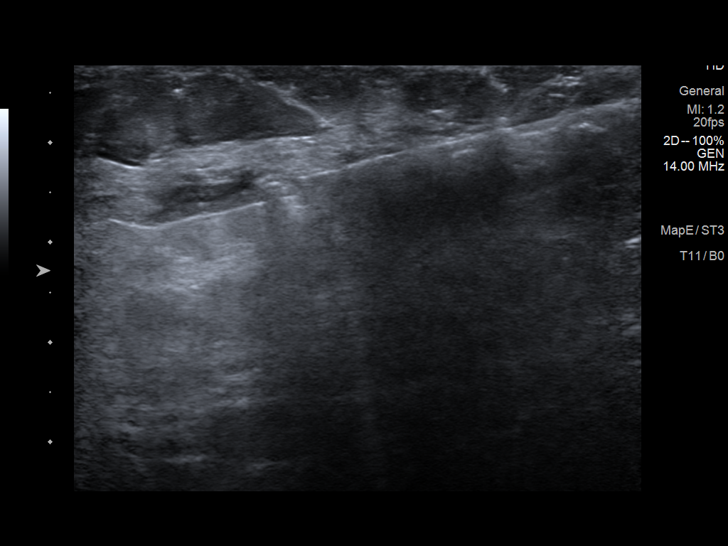
[im 11/13]
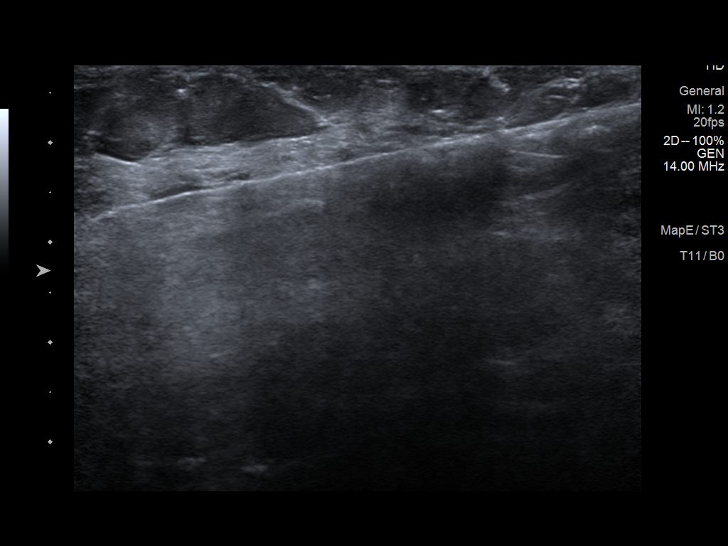
[im 12/13]
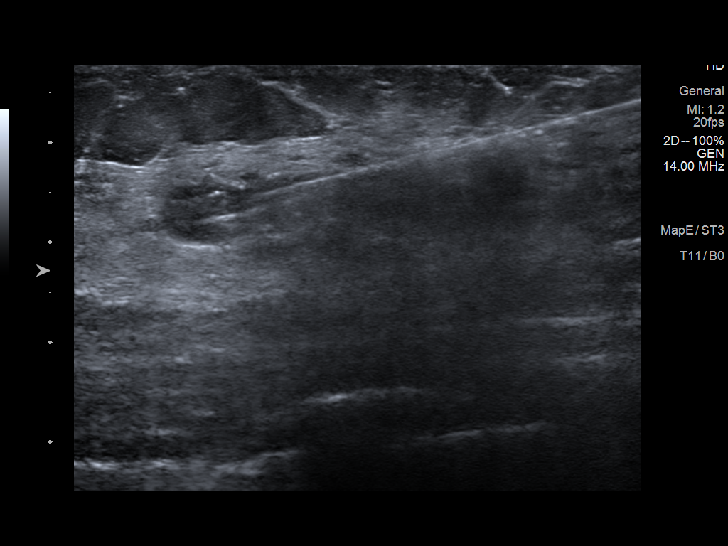
[im 13/13]
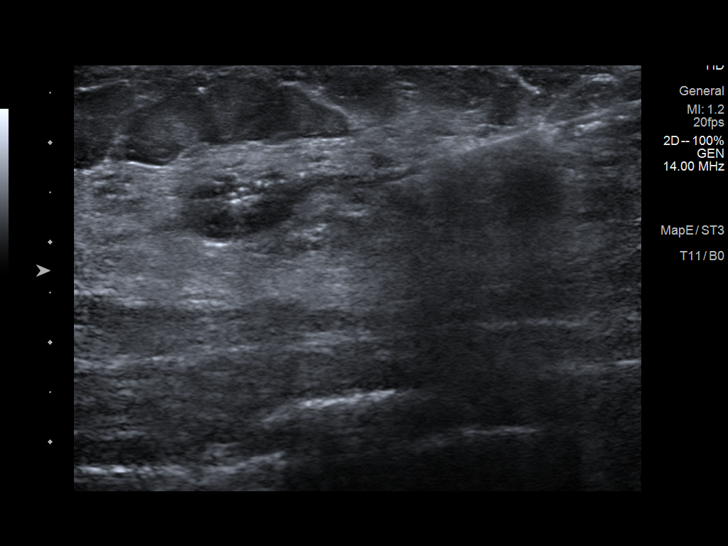

[13 of 13 positions shown; findings below may reference images not displayed]



Lesion quadrant: Upper-outer

Using sterile technique and 1% Lidocaine as local anesthetic, under
direct ultrasound visualization, a 12 gauge [place] device was
used to perform biopsy of a left breast mass at 2 o'clock, 8 cm from
the nipple using a lateral approach. At the conclusion of the
procedure a tissue marker clip was deployed into the biopsy cavity.
Follow up 2 view mammogram was performed and dictated separately.
IMPRESSION: Ultrasound guided biopsy of a left breast mass. No apparent
complications.

## 2019-02-19 IMAGING — MG MM CLIP PLACEMENT
2 series · 2 of 2 positions shown · non-contrast
Comparison: Previous exam(s).

CLINICAL DATA: Evaluation biopsy marker

EXAM:
DIAGNOSTIC LEFT MAMMOGRAM POST ULTRASOUND BIOPSY

[L ML]
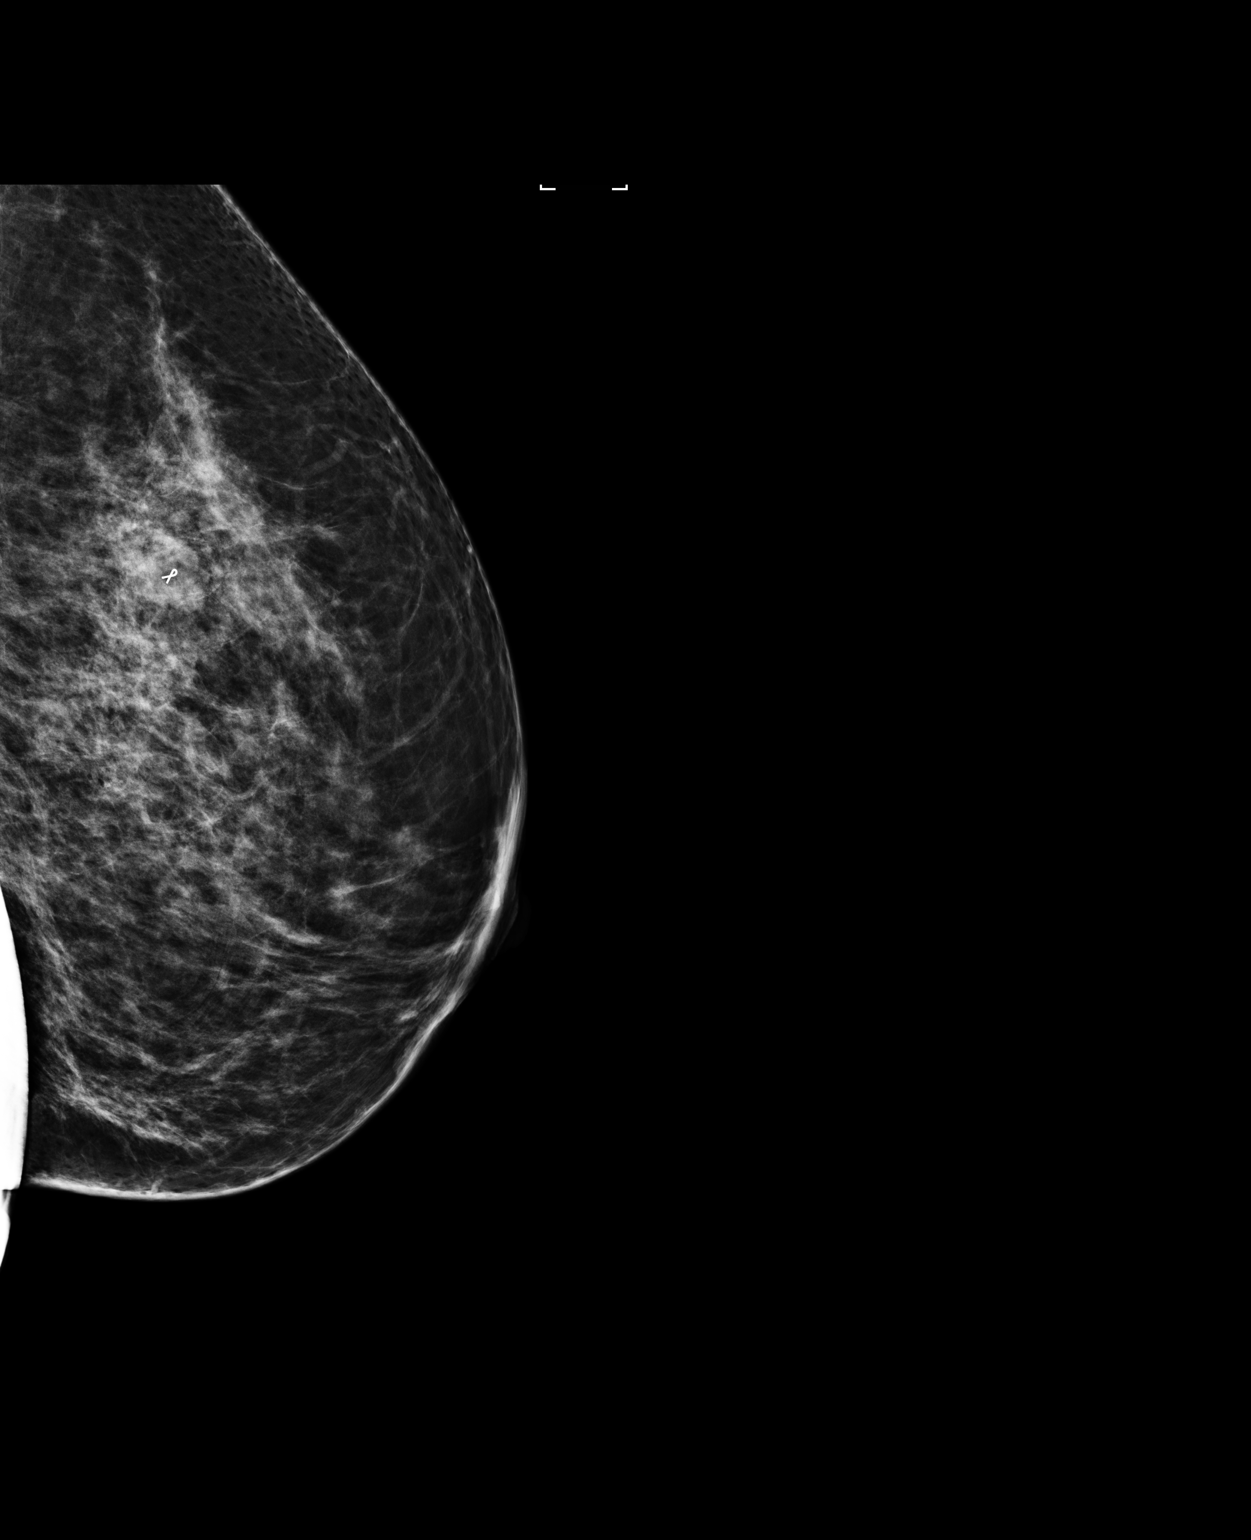

[L CC]
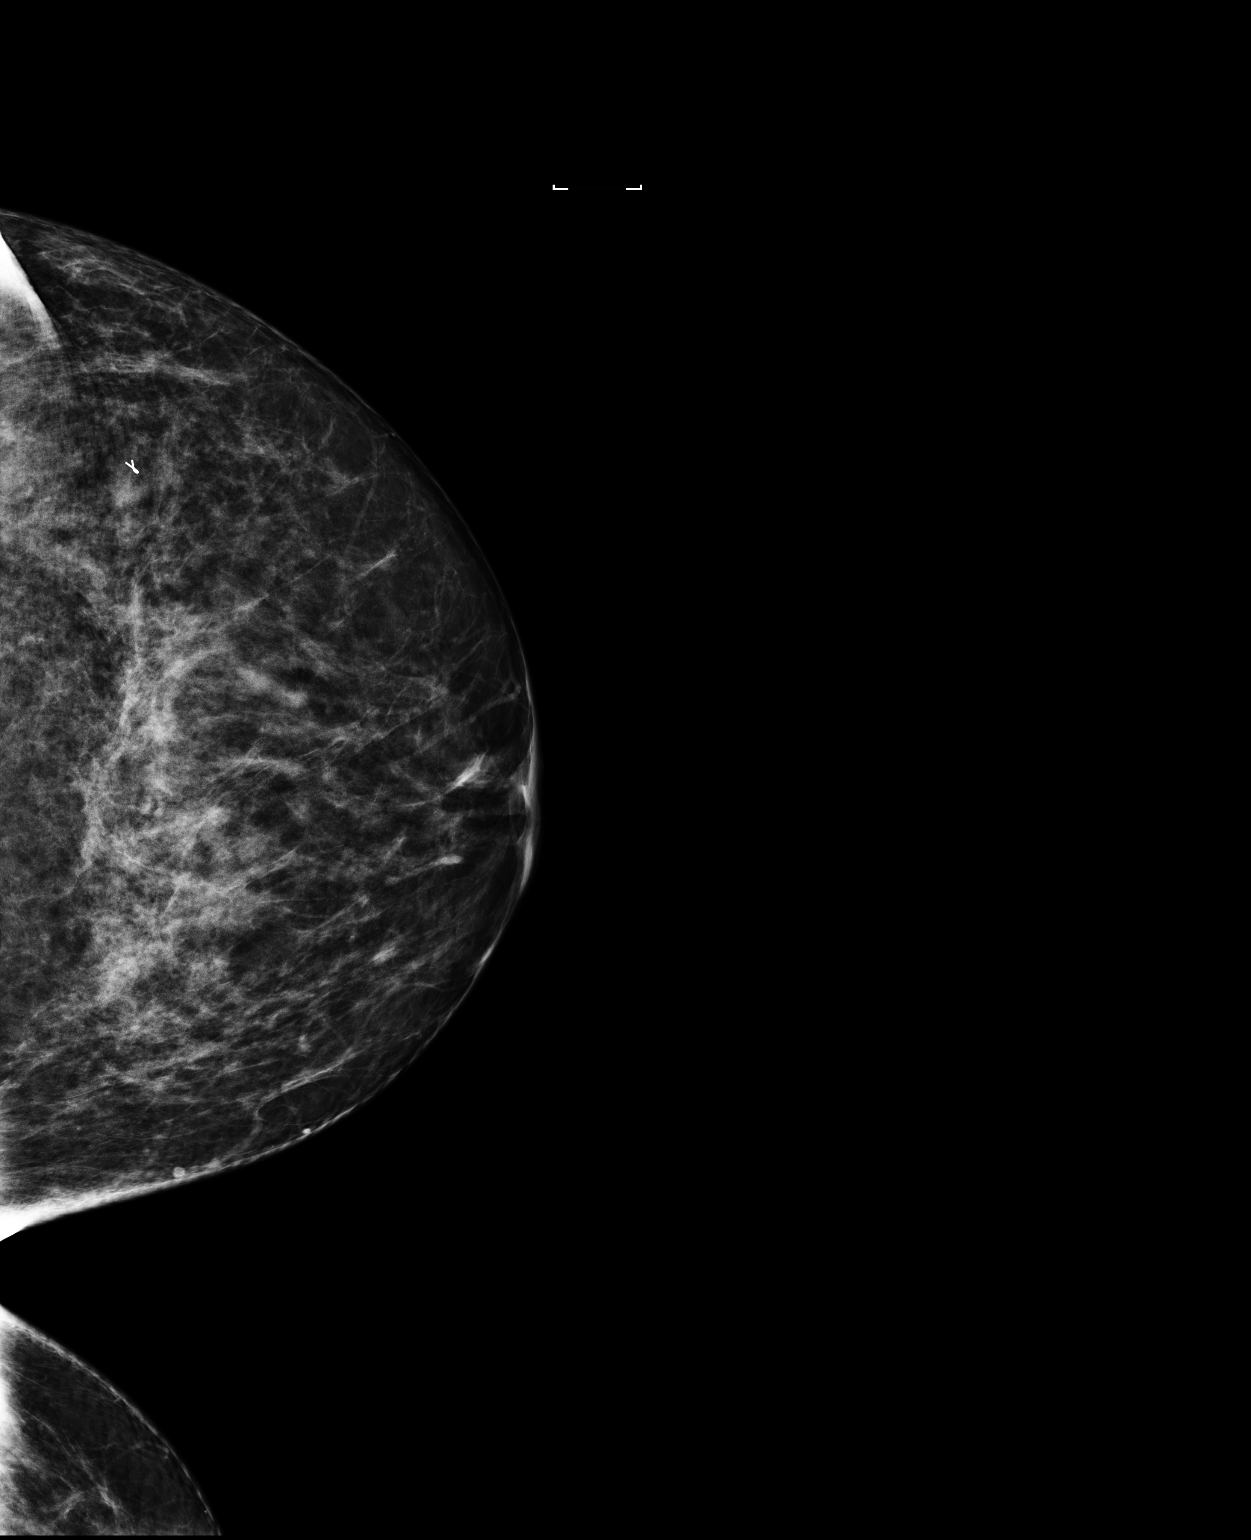

[2 of 2 positions shown; findings below may reference images not displayed]

FINDINGS: Mammographic images were obtained following ultrasound guided biopsy
of a left breast mass. The ribbon shaped biopsy marker is within the
biopsied mass.
IMPRESSION: Appropriate biopsy marker placement.

Final Assessment: Post Procedure Mammograms for Marker Placement

## 2019-03-05 DIAGNOSIS — R7301 Impaired fasting glucose: Secondary | ICD-10-CM | POA: Diagnosis not present

## 2019-03-05 DIAGNOSIS — E039 Hypothyroidism, unspecified: Secondary | ICD-10-CM | POA: Diagnosis not present

## 2019-03-05 DIAGNOSIS — Z0000 Encounter for general adult medical examination without abnormal findings: Secondary | ICD-10-CM | POA: Diagnosis not present

## 2019-03-12 DIAGNOSIS — M25562 Pain in left knee: Secondary | ICD-10-CM | POA: Diagnosis not present

## 2019-03-12 DIAGNOSIS — Z0000 Encounter for general adult medical examination without abnormal findings: Secondary | ICD-10-CM | POA: Diagnosis not present

## 2019-03-12 DIAGNOSIS — M25561 Pain in right knee: Secondary | ICD-10-CM | POA: Diagnosis not present

## 2019-04-02 DIAGNOSIS — M25561 Pain in right knee: Secondary | ICD-10-CM | POA: Diagnosis not present

## 2019-04-02 DIAGNOSIS — M25562 Pain in left knee: Secondary | ICD-10-CM | POA: Diagnosis not present

## 2019-06-12 DIAGNOSIS — Z01419 Encounter for gynecological examination (general) (routine) without abnormal findings: Secondary | ICD-10-CM | POA: Diagnosis not present

## 2019-06-12 DIAGNOSIS — Z1231 Encounter for screening mammogram for malignant neoplasm of breast: Secondary | ICD-10-CM | POA: Diagnosis not present

## 2019-06-12 DIAGNOSIS — Z6827 Body mass index (BMI) 27.0-27.9, adult: Secondary | ICD-10-CM | POA: Diagnosis not present

## 2019-08-29 DIAGNOSIS — Z23 Encounter for immunization: Secondary | ICD-10-CM | POA: Diagnosis not present

## 2021-03-10 ENCOUNTER — Encounter: Payer: Self-pay | Admitting: Internal Medicine

## 2021-05-26 ENCOUNTER — Encounter: Payer: Self-pay | Admitting: Internal Medicine

## 2021-07-07 ENCOUNTER — Ambulatory Visit (INDEPENDENT_AMBULATORY_CARE_PROVIDER_SITE_OTHER): Admitting: Otolaryngology

## 2021-09-02 ENCOUNTER — Ambulatory Visit (AMBULATORY_SURGERY_CENTER): Admitting: *Deleted

## 2021-09-02 ENCOUNTER — Other Ambulatory Visit: Payer: Self-pay

## 2021-09-02 VITALS — Ht 65.0 in | Wt 170.0 lb

## 2021-09-02 DIAGNOSIS — Z86010 Personal history of colonic polyps: Secondary | ICD-10-CM

## 2021-09-02 DIAGNOSIS — Z800 Family history of malignant neoplasm of digestive organs: Secondary | ICD-10-CM

## 2021-09-02 MED ORDER — PEG-KCL-NACL-NASULF-NA ASC-C 100 G PO SOLR: 1.0000 | Freq: Once | ORAL | 0 refills | Status: AC

## 2021-09-02 NOTE — Progress Notes (Signed)
No egg or soy allergy known to patient  No issues with past sedation with any surgeries or procedures Patient denies ever being told they had issues or difficulty with intubation  No FH of Malignant Hyperthermia No diet pills per patient No home 02 use per patient  No blood thinners per patient  Pt denies issues with constipation  No A fib or A flutter  EMMI video to pt or via [place] 19 guidelines implemented in [place] today with Pt and RN   Pt is fully vaccinated  for [employer] given to pt in PV today , Code to Pharmacy and  NO PA's for preps discussed with pt In PV today  Discussed with pt there will be an out-of-pocket cost for prep and that varies from $0 to 70 +  dollars   Due to the COVID-19 pandemic we are asking patients to follow certain guidelines.  Pt aware of COVID protocols and [facility] guidelines  Pt verified name, DOB, address and insurance during PV today.  Pt mailed instruction packet of Emmi video, copy of consent form to read and not return, and instructions. [employer]  coupon mailed in packet. PV completed over the phone.  Pt encouraged to call with questions or issues.  My Chart instructions to pt as well

## 2021-09-14 ENCOUNTER — Telehealth: Payer: Self-pay

## 2021-09-14 ENCOUNTER — Other Ambulatory Visit: Payer: Self-pay

## 2021-09-14 DIAGNOSIS — Z86010 Personal history of colonic polyps: Secondary | ICD-10-CM

## 2021-09-14 NOTE — Telephone Encounter (Signed)
-----   Message from Irene Shipper, MD sent at 09/13/2021  3:00 PM EDT ----- Regarding: RE: [place] pt Thank you John.  Vaughan Basta, Please contact the patient and let her know that her case with be scheduled at the hospital due to documented difficult intubation. Please arrange. This is routine for colon polyp surveillance. Thanks, JP  ----- Message ----- From: Osvaldo Angst, CRNA Sent: 09/12/2021   7:07 PM EDT To: Irene Shipper, MD Subject: [facility] pt                                         Dr. Henrene Pastor,  This pt is a difficult intubation and her procedure will need to be done at the hospital.  Thanks,  Osvaldo Angst

## 2021-09-14 NOTE — Telephone Encounter (Signed)
Pt was scheduled an Colonoscopy Appointment on 10/13/2021 @ 9:30 at [facility] . Pt made aware. A letter was mailed to pt with updated information regarding prep for procedure. Pt verbalized understanding with all questions answered.

## 2021-09-16 ENCOUNTER — Encounter: Payer: BLUE CROSS/BLUE SHIELD | Admitting: Internal Medicine

## 2021-10-04 ENCOUNTER — Encounter (HOSPITAL_COMMUNITY): Payer: Self-pay | Admitting: Internal Medicine

## 2021-10-04 NOTE — Progress Notes (Signed)
Attempted to obtain medical history via telephone, unable to reach at this time. I left a voicemail to return pre surgical testing department's phone call.  

## 2021-10-13 ENCOUNTER — Encounter (HOSPITAL_COMMUNITY): Payer: Self-pay | Admitting: Internal Medicine

## 2021-10-13 ENCOUNTER — Other Ambulatory Visit: Payer: Self-pay

## 2021-10-13 ENCOUNTER — Ambulatory Visit (HOSPITAL_COMMUNITY)
Admission: RE | Admit: 2021-10-13 | Discharge: 2021-10-13 | Disposition: A | Discharge status: Home / Self Care | Attending: Internal Medicine | Admitting: Internal Medicine

## 2021-10-13 ENCOUNTER — Encounter (HOSPITAL_COMMUNITY)
Admission: RE | Disposition: A | Discharge status: Home / Self Care | Payer: Self-pay | Source: Home / Self Care | Attending: Internal Medicine

## 2021-10-13 ENCOUNTER — Ambulatory Visit (HOSPITAL_COMMUNITY): Admitting: Anesthesiology

## 2021-10-13 DIAGNOSIS — Z860100 Personal history of colon polyps, unspecified: Secondary | ICD-10-CM

## 2021-10-13 DIAGNOSIS — Z09 Encounter for follow-up examination after completed treatment for conditions other than malignant neoplasm: Secondary | ICD-10-CM | POA: Insufficient documentation

## 2021-10-13 DIAGNOSIS — Z882 Allergy status to sulfonamides status: Secondary | ICD-10-CM | POA: Diagnosis not present

## 2021-10-13 DIAGNOSIS — Z800 Family history of malignant neoplasm of digestive organs: Secondary | ICD-10-CM | POA: Insufficient documentation

## 2021-10-13 HISTORY — PX: COLONOSCOPY WITH PROPOFOL: SHX5780

## 2021-10-13 HISTORY — DX: Failed or difficult intubation, initial encounter: T88.4XXA

## 2021-10-13 SURGERY — COLONOSCOPY WITH PROPOFOL
Anesthesia: Monitor Anesthesia Care

## 2021-10-13 MED ORDER — LACTATED RINGERS IV SOLN
INTRAVENOUS | Status: AC | PRN
  Administered 2021-10-13: 10 mL/h via INTRAVENOUS

## 2021-10-13 MED ORDER — SODIUM CHLORIDE 0.9 % IV SOLN: INTRAVENOUS | Status: DC

## 2021-10-13 MED ORDER — LIDOCAINE 2% (20 MG/ML) 5 ML SYRINGE
INTRAMUSCULAR | Status: DC | PRN
  Administered 2021-10-13: 80 mg via INTRAVENOUS

## 2021-10-13 MED ORDER — PROPOFOL 500 MG/50ML IV EMUL
INTRAVENOUS | Status: DC | PRN
  Administered 2021-10-13: 125 ug/kg/min via INTRAVENOUS

## 2021-10-13 MED ORDER — PROPOFOL 500 MG/50ML IV EMUL
INTRAVENOUS | Status: AC
  Filled 2021-10-13: qty 50

## 2021-10-13 MED ORDER — PROPOFOL 10 MG/ML IV BOLUS
INTRAVENOUS | Status: DC | PRN
  Administered 2021-10-13: 40 mg via INTRAVENOUS
  Administered 2021-10-13: 20 mg via INTRAVENOUS

## 2021-10-13 SURGICAL SUPPLY — 22 items

## 2021-10-13 NOTE — Op Note (Signed)
[facility] Patient Name: Caitlin Hansen Procedure Date: 10/13/2021 MRN: [id number] Attending MD: Docia Chuck. Henrene Pastor , MD Date of Birth: [birth year] CSN: [id number] Age: 55 Admit Type: Outpatient Procedure:                Colonoscopy Indications:              High risk colon cancer surveillance: Personal                            history of adenoma (10 mm or greater in size). Also                            family history of colon cancer in multiple first                            and second-degree relatives. Previous examinations                            2009, 2015, 2019 Providers:                Docia Chuck. Henrene Pastor, MD, Truddie Coco, RN, Particia Nearing, RN,                            [facility], CRNA Referring MD:             Recall colonoscopy Medicines:                Monitored Anesthesia Care Complications:            No immediate complications. Estimated blood loss:                            None. Estimated Blood Loss:     Estimated blood loss: none. Procedure:                Pre-Anesthesia Assessment:                           - Prior to the procedure, a History and Physical                            was performed, and patient medications and                            allergies were reviewed. The patient's tolerance of                            previous anesthesia was also reviewed. The risks                            and benefits of the procedure and the sedation                            options and risks were discussed with the patient.  All questions were answered, and informed consent                            was obtained. Prior Anticoagulants: The patient has                            taken no previous anticoagulant or antiplatelet                            agents. ASA Grade Assessment: II - A patient with                            mild systemic disease. After reviewing the risks                            and benefits,  the patient was deemed in                            satisfactory condition to undergo the procedure.                           After obtaining informed consent, the colonoscope                            was passed under direct vision. Throughout the                            procedure, the patient's blood pressure, pulse, and                            oxygen saturations were monitored continuously. The                            CF-HQ190L ([id number] Olympus colonoscope was                            introduced through the anus and advanced to the the                            cecum, identified by appendiceal orifice and                            ileocecal valve. The ileocecal valve, appendiceal                            orifice, and rectum were photographed. The quality                            of the bowel preparation was excellent. The                            colonoscopy was performed without difficulty. The  patient tolerated the procedure well. The bowel                            preparation used was SUPREP via split dose                            instruction. Scope In: 9:57:10 AM Scope Out: 10:10:04 AM Scope Withdrawal Time: 0 hours 10 minutes 31 seconds  Total Procedure Duration: 0 hours 12 minutes 54 seconds  Findings:      The entire examined colon appeared normal on direct and retroflexion       views. Impression:               - The entire examined colon is normal on direct and                            retroflexion views.                           - No specimens collected. Moderate Sedation:      none Recommendation:           - Repeat colonoscopy in 5 years for surveillance                            (family and personal history).                           - Patient has a contact number available for                            emergencies. The signs and symptoms of potential                            delayed complications were  discussed with the                            patient. Return to normal activities tomorrow.                            Written discharge instructions were provided to the                            patient.                           - Resume previous diet.                           - Continue present medications. Procedure Code(s):        --- Professional ---                           [phone], Colonoscopy, flexible; diagnostic, including                            collection of specimen(s) by brushing or washing,  when performed (separate procedure) Diagnosis Code(s):        --- Professional ---                           Z86.010, Personal history of colonic polyps CPT copyright 2019 [employer]. All rights reserved. The codes documented in this report are preliminary and upon coder review may  be revised to meet current compliance requirements. Docia Chuck. Henrene Pastor, MD 10/13/2021 10:39:19 AM This report has been signed electronically. Number of Addenda: 0

## 2021-10-13 NOTE — Transfer of Care (Signed)
Immediate Anesthesia Transfer of Care Note  Patient: Caitlin Hansen  Procedure(s) Performed: COLONOSCOPY WITH PROPOFOL  Patient Location: PACU  Anesthesia Type:MAC  Level of Consciousness: awake, alert  and oriented  Airway & Oxygen Therapy: Patient Spontanous Breathing and Patient connected to face mask oxygen  Post-op Assessment: Report given to RN and Post -op Vital signs reviewed and stable  Post vital signs: Reviewed and stable  Last Vitals:  Vitals Value Taken Time  BP 144/75 10/13/21 1018  Temp    Pulse 75 10/13/21 1019  Resp 15 10/13/21 1019  SpO2 100 % 10/13/21 1019  Vitals shown include unvalidated device data.  Last Pain:  Vitals:   10/13/21 0847  TempSrc: Oral  PainSc: 0-No pain         Complications: No notable events documented.

## 2021-10-13 NOTE — H&P (Signed)
HISTORY OF PRESENT ILLNESS:  Caitlin Hansen is a 55 y.o. female who presents today for surveillance colonoscopy.  She has a family history of colon cancer in multiple first-degree as well as second-degree relatives.  She is undergone prior colonoscopy in 2009, 2015, and 2019.  Her first 2 examinations were negative for neoplasia.  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  She presents today for follow-up at the appropriate time.  No relevant clinical complaints.  She tolerated her prep well.  Her examination is being done at the hospital due to a reported history of difficult intubation.  REVIEW OF SYSTEMS:  All non-GI ROS negative.  Past Medical History:  Diagnosis Date   Allergic rhinitis    Depression    hx - after lost job- not current per pt   Difficult intubation    GERD (gastroesophageal reflux disease)    History of thyroid disease    patient took medication, but no longer requires medication   Hyperglycemia 2012   Dr. Bobbye Charleston   Iron deficiency anemia    past hx   Kidney stone    Knee pain, bilateral    aching, popping, "feels like they are going to give out"    Past Surgical History:  Procedure Laterality Date   BACK SURGERY  12/25/1994   cyst removal   CESAREAN SECTION  08/15/1990   COLONOSCOPY     CYSTOSCOPY N/A 04/19/2016   Procedure: CYSTOSCOPY;  Surgeon: Bobbye Charleston, MD;  Location: [place] [facility];  Service: Gynecology;  Laterality: N/A;   POLYPECTOMY     ROBOTIC ASSISTED TOTAL HYSTERECTOMY WITH SALPINGECTOMY Bilateral 04/19/2016   Procedure: ROBOTIC ASSISTED TOTAL HYSTERECTOMY WITH SALPINGECTOMY;  Surgeon: Bobbye Charleston, MD;  Location: [facility] [place] [facility];  Service: Gynecology;  Laterality: Bilateral;   ROTATOR CUFF REPAIR     WISDOM TOOTH EXTRACTION      Social History Caitlin Hansen  reports that she has never smoked. She has never used smokeless tobacco. She reports current alcohol use. She reports that she does not use drugs.  family  history includes Cancer in an other family member; Colon cancer in her maternal grandmother; Colon cancer (age of onset: 18) in her mother; Colon cancer (age of onset: 70) in her maternal uncle; Diabetes in her maternal aunt and maternal aunt; Rectal cancer in her brother.  Allergies  Allergen Reactions   Sulfamethoxazole-Trimethoprim Other (See Comments)    REACTION: vaginal itching       PHYSICAL EXAMINATION:  Vital signs: BP (!) 157/86   Pulse 76   Temp 97.9 F (36.6 C) (Oral)   Resp 10   Ht 5\' 5"  (1.651 m)   Wt 75.3 kg   LMP 09/02/2013   SpO2 100%   BMI 27.62 kg/m  General: Well-developed, well-nourished, no acute distress HEENT: Sclerae are anicteric, conjunctiva pink. Oral mucosa intact Lungs: Clear Heart: Regular Abdomen: soft, nontender, nondistended, no obvious ascites, no peritoneal signs, normal bowel sounds. No organomegaly. Extremities: No edema Psychiatric: alert and oriented x3. Cooperative     ASSESSMENT:  1.  Family history of colon cancer and personal history of advanced adenoma   PLAN:   1.  Surveillance colonoscopy

## 2021-10-13 NOTE — Discharge Instructions (Signed)
YOU HAD AN ENDOSCOPIC PROCEDURE TODAY: Refer to the procedure report and other information in the discharge instructions given to you for any specific questions about what was found during the examination. If this information does not answer your questions, please call [place] office at [phone] to clarify.  ° °YOU SHOULD EXPECT: Some feelings of bloating in the abdomen. Passage of more gas than usual. Walking can help get rid of the air that was put into your GI tract during the procedure and reduce the bloating. If you had a lower endoscopy (such as a colonoscopy or flexible sigmoidoscopy) you may notice spotting of blood in your stool or on the toilet paper. Some abdominal soreness may be present for a day or two, also. ° °DIET: Your first meal following the procedure should be a light meal and then it is ok to progress to your normal diet. A half-sandwich or bowl of soup is an example of a good first meal. Heavy or fried foods are harder to digest and may make you feel nauseous or bloated. Drink plenty of fluids but you should avoid alcoholic beverages for 24 hours. If you had a esophageal dilation, please see attached instructions for diet.   ° °ACTIVITY: Your care partner should take you home directly after the procedure. You should plan to take it easy, moving slowly for the rest of the day. You can resume normal activity the day after the procedure however YOU SHOULD NOT DRIVE, use power tools, machinery or perform tasks that involve climbing or major physical exertion for 24 hours (because of the sedation medicines used during the test).  ° °SYMPTOMS TO REPORT IMMEDIATELY: °A gastroenterologist can be reached at any hour. Please call [phone]  for any of the following symptoms:  °Following lower endoscopy (colonoscopy, flexible sigmoidoscopy) °Excessive amounts of blood in the stool  °Significant tenderness, worsening of abdominal pains  °Swelling of the abdomen that is new, acute  °Fever of 100° or  higher  °Following upper endoscopy (EGD, EUS, ERCP, esophageal dilation) °Vomiting of blood or coffee ground material  °New, significant abdominal pain  °New, significant chest pain or pain under the shoulder blades  °Painful or persistently difficult swallowing  °New shortness of breath  °Black, tarry-looking or red, bloody stools ° °FOLLOW UP:  °If any biopsies were taken you will be contacted by phone or by letter within the next 1-3 weeks. Call [phone]  if you have not heard about the biopsies in 3 weeks.  °Please also call with any specific questions about appointments or follow up tests. ° °

## 2021-10-13 NOTE — Anesthesia Preprocedure Evaluation (Addendum)
Anesthesia Evaluation  Patient identified by MRN, date of birth, ID band Patient awake    Reviewed: Allergy & Precautions, NPO status , Patient's Chart, lab work & pertinent test results  History of Anesthesia Complications Negative for: history of anesthetic complications  Airway Mallampati: II  TM Distance: >3 FB Neck ROM: Full    Dental  (+) Missing,    Pulmonary neg pulmonary ROS,    Pulmonary exam normal        Cardiovascular negative cardio ROS Normal cardiovascular exam     Neuro/Psych negative neurological ROS     GI/Hepatic Neg liver ROS, GERD  ,  Endo/Other  negative endocrine ROS  Renal/GU negative Renal ROS  negative genitourinary   Musculoskeletal negative musculoskeletal ROS (+)   Abdominal   Peds  Hematology negative hematology ROS (+)   Anesthesia Other Findings Day of surgery medications reviewed with patient.  Reproductive/Obstetrics negative OB ROS                            Anesthesia Physical Anesthesia Plan  ASA: 2  Anesthesia Plan: MAC   Post-op Pain Management:    Induction:   PONV Risk Score and Plan: Treatment may vary due to age or medical condition and Propofol infusion  Airway Management Planned: Natural Airway and Nasal Cannula  Additional Equipment:   Intra-op Plan:   Post-operative Plan:   Informed Consent: I have reviewed the patients History and Physical, chart, labs and discussed the procedure including the risks, benefits and alternatives for the proposed anesthesia with the patient or authorized representative who has indicated his/her understanding and acceptance.       Plan Discussed with: CRNA  Anesthesia Plan Comments:        Anesthesia Quick Evaluation

## 2021-10-13 NOTE — Anesthesia Postprocedure Evaluation (Signed)
Anesthesia Post Note  Patient: Caitlin Hansen  Procedure(s) Performed: COLONOSCOPY WITH PROPOFOL     Patient location during evaluation: PACU Anesthesia Type: MAC Level of consciousness: awake and alert and oriented Pain management: pain level controlled Vital Signs Assessment: post-procedure vital signs reviewed and stable Respiratory status: spontaneous breathing, nonlabored ventilation and respiratory function stable Cardiovascular status: blood pressure returned to baseline Postop Assessment: no apparent nausea or vomiting Anesthetic complications: no   No notable events documented.  Last Vitals:  Vitals:   10/13/21 1030 10/13/21 1037  BP: (!) 151/80 (!) 164/95  Pulse: 89 77  Resp: 14 (!) 9  Temp:    SpO2: 100% 100%    Last Pain:  Vitals:   10/13/21 1037  TempSrc:   PainSc: 0-No pain                 Marthenia Rolling

## 2021-10-14 ENCOUNTER — Encounter (HOSPITAL_COMMUNITY): Payer: Self-pay | Admitting: Internal Medicine

## 2022-04-20 ENCOUNTER — Other Ambulatory Visit: Payer: Self-pay | Admitting: Registered Nurse

## 2022-04-20 DIAGNOSIS — R7303 Prediabetes: Secondary | ICD-10-CM

## 2022-05-05 ENCOUNTER — Ambulatory Visit (HOSPITAL_COMMUNITY)
Admission: EM | Admit: 2022-05-05 | Discharge: 2022-05-05 | Disposition: A | Discharge status: Home / Self Care | Attending: Family Medicine | Admitting: Family Medicine

## 2022-05-05 ENCOUNTER — Encounter (HOSPITAL_COMMUNITY): Payer: Self-pay

## 2022-05-05 ENCOUNTER — Ambulatory Visit (INDEPENDENT_AMBULATORY_CARE_PROVIDER_SITE_OTHER)

## 2022-05-05 DIAGNOSIS — M25571 Pain in right ankle and joints of right foot: Secondary | ICD-10-CM | POA: Diagnosis not present

## 2022-05-05 NOTE — ED Provider Notes (Signed)
?[facility] ? ? ? ?CSN: [id number] ?Arrival date & time: 05/05/22  4098 ? ? ?  ? ?History   ?Chief Complaint ?Chief Complaint  ?Patient presents with  ? Ankle Pain  ? ? ?HPI ?Caitlin Hansen is a 56 y.o. female.  ? ?Patient is here for right ankle pain.  ?Some pain since feb.  She has taken some falls here and there.  ?She has noted some pains through her ankle with going up/down stairs, and would usually resolve quickly.  ?She has noted more pains over all, and this weekend she noted more pain daily.  No pain while sitting, but will have pain if she walks for a while.  ?She did take ibuprofen with help.   No swelling noted.  ? ?Past Medical History:  ?Diagnosis Date  ? Allergic rhinitis   ? Depression   ? hx - after lost job- not current per pt  ? Difficult intubation   ? GERD (gastroesophageal reflux disease)   ? History of thyroid disease   ? patient took medication, but no longer requires medication  ? Hyperglycemia 2012  ? Dr. Bobbye Charleston  ? Iron deficiency anemia   ? past hx  ? Kidney stone   ? Knee pain, bilateral   ? aching, popping, "feels like they are going to give out"  ? ? ?Patient Active Problem List  ? Diagnosis Date Noted  ? History of colonic polyps   ? Family history of colon cancer   ? Postoperative state 04/19/2016  ? ? ?Past Surgical History:  ?Procedure Laterality Date  ? BACK SURGERY  12/25/1994  ? cyst removal  ? CESAREAN SECTION  08/15/1990  ? COLONOSCOPY    ? COLONOSCOPY WITH PROPOFOL N/A 10/13/2021  ? Procedure: COLONOSCOPY WITH PROPOFOL;  Surgeon: Irene Shipper, MD;  Location: [facility];  Service: Endoscopy;  Laterality: N/A;  ? CYSTOSCOPY N/A 04/19/2016  ? Procedure: CYSTOSCOPY;  Surgeon: Bobbye Charleston, MD;  Location: [facility] [facility];  Service: Gynecology;  Laterality: N/A;  ? POLYPECTOMY    ? ROBOTIC ASSISTED TOTAL HYSTERECTOMY WITH SALPINGECTOMY Bilateral 04/19/2016  ? Procedure: ROBOTIC ASSISTED TOTAL HYSTERECTOMY WITH SALPINGECTOMY;  Surgeon: Bobbye Charleston, MD;   Location: [facility];  Service: Gynecology;  Laterality: Bilateral;  ? ROTATOR CUFF REPAIR    ? WISDOM TOOTH EXTRACTION    ? ? ?OB History   ?No obstetric history on file. ?  ? ? ? ?Home Medications   ? ?Prior to Admission medications   ?Medication Sig Start Date End Date Taking? Authorizing Provider  ?acetaminophen (TYLENOL) 650 MG CR tablet Take 650 mg by mouth every 8 (eight) hours as needed for pain.    [provider]  ?Cholecalciferol (VITAMIN D3 PO) Take 1 tablet by mouth 2 (two) times a week. Vita fusion    [provider]  ?Multiple Vitamins-Calcium (ONE-A-DAY WOMENS) tablet Take 2 tablets by mouth daily. Gummies    [provider]  ? ? ?Family History ?Family History  ?Problem Relation Age of Onset  ? Colon cancer Mother 29  ? Rectal cancer Brother   ? Diabetes Maternal Aunt   ? Diabetes Maternal Aunt   ? Colon cancer Maternal Uncle 45  ? Colon cancer Maternal Grandmother   ? Cancer Other   ?     Colon  ? Stomach cancer Neg Hx   ? Esophageal cancer Neg Hx   ? Colon polyps Neg Hx   ? ? ?Social History ?Social History  ? ?Tobacco Use  ?  Smoking status: Never  ? Smokeless tobacco: Never  ?Vaping Use  ? Vaping Use: Never used  ?Substance Use Topics  ? Alcohol use: Yes  ?  Alcohol/week: 0.0 standard drinks  ?  Comment: Rare; social drinker  ? Drug use: No  ? ? ? ?Allergies   ?Sulfamethoxazole-trimethoprim ? ? ?Review of Systems ?Review of Systems  ?Constitutional: Negative.   ?HENT: Negative.    ?Respiratory: Negative.    ?Cardiovascular: Negative.   ?Gastrointestinal: Negative.   ?Genitourinary: Negative.   ?Musculoskeletal:  Positive for gait problem.  ? ? ?Physical Exam ?Triage Vital Signs ?ED Triage Vitals  ?Enc Vitals Group  ?   BP 05/05/22 0910 (!) 166/99  ?   Pulse Rate 05/05/22 0910 90  ?   Resp 05/05/22 0910 18  ?   Temp 05/05/22 0910 98.1 ?F (36.7 ?C)  ?   Temp Source 05/05/22 0910 Oral  ?   SpO2 05/05/22 0910 98 %  ?   Weight --   ?   Height --   ?   Head Circumference --   ?    Peak Flow --   ?   Pain Score 05/05/22 0911 7  ?   Pain Loc --   ?   Pain Edu? --   ?   Excl. in [place]? --   ? ?No data found. ? ?Updated Vital Signs ?BP (!) 166/99 (BP Location: Left Arm)   Pulse 90   Temp 98.1 ?F (36.7 ?C) (Oral)   Resp 18   LMP 09/02/2013   SpO2 98%  ? ?Visual Acuity ?Right Eye Distance:   ?Left Eye Distance:   ?Bilateral Distance:   ? ?Right Eye Near:   ?Left Eye Near:    ?Bilateral Near:    ? ?Physical Exam ?Constitutional:   ?   Appearance: Normal appearance.  ?Musculoskeletal:  ?   Comments: No swelling to the right ankle;  slightly tender at the posterior aspect of the medial malleolus on the right;  more tenderness in the soft tissue behind the medial malleolus  ?Skin: ?   General: Skin is warm.  ?Neurological:  ?   General: No focal deficit present.  ?   Mental Status: She is alert.  ?Psychiatric:     ?   Mood and Affect: Mood normal.  ? ? ? ?UC Treatments / Results  ?Labs ?(all labs ordered are listed, but only abnormal results are displayed) ?Labs Reviewed - No data to display ? ?EKG ? ? ?Radiology ?DG Ankle Complete Right ? ?Result Date: 05/05/2022 ?CLINICAL DATA:  Medial right RIGHT ankle pain. EXAM: RIGHT ANKLE - COMPLETE 3+ VIEW COMPARISON:  None available FINDINGS: There is no evidence of fracture, dislocation, or joint effusion. There is no evidence of arthropathy or other focal bone abnormality. Soft tissues are unremarkable. IMPRESSION: Negative. Electronically Signed   By: Zetta Bills M.D.   On: 05/05/2022 09:41   ? ?Procedures ?Procedures (including critical care time) ? ?Medications Ordered in UC ?Medications - No data to display ? ?Initial Impression / Assessment and Plan / UC Course  ?I have reviewed the triage vital signs and the nursing notes. ? ?Pertinent labs & imaging results that were available during my care of the patient were reviewed by me and considered in my medical decision making (see chart for details). ? ?  ?Final Clinical Impressions(s) / UC Diagnoses   ? ?Final diagnoses:  ?Acute right ankle pain  ? ? ? ?Discharge Instructions   ? ?  ?  You were seen today for ankle pain.  ?Your xray was negative for fracture or arthritis today.  ?I recommend you follow up with your primary care provider for further discussion and care.  ?I recommend over the counter anti-inflammatory to help with pain in the mean time.  ? ? ? ?ED Prescriptions   ?None ?  ? ?PDMP not reviewed this encounter. ?  Rondel Oh, MD ?05/05/22 [phone] ? ?

## 2022-05-05 NOTE — ED Triage Notes (Signed)
Pt c/o rt ankle pain for the past few months and has worsen this past week. States she has had a few falls 2 months ago d/t having knee problems. Denies any new injury. States took ibuprofen last night with relief. ?

## 2022-05-05 NOTE — Discharge Instructions (Addendum)
You were seen today for ankle pain.  ?Your xray was negative for fracture or arthritis today.  ?I recommend you follow up with your primary care provider for further discussion and care.  ?I recommend over the counter anti-inflammatory to help with pain in the mean time.  ?

## 2022-05-16 ENCOUNTER — Other Ambulatory Visit

## 2022-05-26 ENCOUNTER — Ambulatory Visit
Admission: RE | Admit: 2022-05-26 | Discharge: 2022-05-26 | Disposition: A | Discharge status: Home / Self Care | Payer: No Typology Code available for payment source | Source: Ambulatory Visit | Attending: Registered Nurse | Admitting: Registered Nurse

## 2022-05-26 DIAGNOSIS — R7303 Prediabetes: Secondary | ICD-10-CM

## 2022-10-02 ENCOUNTER — Other Ambulatory Visit: Payer: Self-pay | Admitting: Registered Nurse

## 2022-10-02 DIAGNOSIS — R221 Localized swelling, mass and lump, neck: Secondary | ICD-10-CM

## 2022-10-02 DIAGNOSIS — Z808 Family history of malignant neoplasm of other organs or systems: Secondary | ICD-10-CM

## 2022-10-04 ENCOUNTER — Ambulatory Visit
Admission: RE | Admit: 2022-10-04 | Discharge: 2022-10-04 | Disposition: A | Discharge status: Home / Self Care | Source: Ambulatory Visit | Attending: Registered Nurse | Admitting: Registered Nurse

## 2022-10-04 DIAGNOSIS — R221 Localized swelling, mass and lump, neck: Secondary | ICD-10-CM

## 2022-10-04 DIAGNOSIS — Z808 Family history of malignant neoplasm of other organs or systems: Secondary | ICD-10-CM

## 2023-01-19 ENCOUNTER — Ambulatory Visit: Admitting: Family Medicine

## 2023-03-02 ENCOUNTER — Ambulatory Visit: Admitting: Family Medicine
